# Patient Record
Sex: Male | Born: 1970
Health system: Southern US, Community
[De-identification: ages and names within clinical notes are randomized; demographics above are authoritative.]

## PROBLEM LIST (undated history)

## (undated) DIAGNOSIS — E119 Type 2 diabetes mellitus without complications: Principal | ICD-10-CM

## (undated) DIAGNOSIS — R7989 Other specified abnormal findings of blood chemistry: Secondary | ICD-10-CM

## (undated) HISTORY — PX: SHOULDER ARTHROSCOPY: SHX128

## (undated) HISTORY — PX: INCISE AND DRAIN ABCESS: PRO64

## (undated) HISTORY — PX: APPENDECTOMY: SHX54

## (undated) HISTORY — DX: Type 2 diabetes mellitus without complications: E11.9

## (undated) HISTORY — DX: Other specified abnormal findings of blood chemistry: R79.89

---

## 2002-06-11 ENCOUNTER — Emergency Department (HOSPITAL_COMMUNITY): Admission: EM | Admit: 2002-06-11 | Discharge: 2002-06-11 | Payer: Self-pay | Admitting: Emergency Medicine

## 2002-06-11 ENCOUNTER — Encounter: Payer: Self-pay | Admitting: Emergency Medicine

## 2012-11-18 LAB — HEMOGLOBIN A1C: Hgb A1c MFr Bld: 12.7 % — AB (ref 4.0–6.0)

## 2012-11-18 LAB — CBC AND DIFFERENTIAL: Hemoglobin: 13.3 g/dL — AB (ref 13.5–17.5)

## 2012-11-27 ENCOUNTER — Ambulatory Visit (INDEPENDENT_AMBULATORY_CARE_PROVIDER_SITE_OTHER): Payer: BC Managed Care – PPO | Admitting: Family Medicine

## 2012-11-27 ENCOUNTER — Encounter: Payer: Self-pay | Admitting: Family Medicine

## 2012-11-27 VITALS — BP 136/68 | HR 93 | Temp 97.8°F | Ht 70.5 in | Wt 219.0 lb

## 2012-11-27 DIAGNOSIS — E1169 Type 2 diabetes mellitus with other specified complication: Secondary | ICD-10-CM | POA: Insufficient documentation

## 2012-11-27 DIAGNOSIS — L0291 Cutaneous abscess, unspecified: Secondary | ICD-10-CM

## 2012-11-27 DIAGNOSIS — B351 Tinea unguium: Secondary | ICD-10-CM | POA: Insufficient documentation

## 2012-11-27 DIAGNOSIS — E119 Type 2 diabetes mellitus without complications: Secondary | ICD-10-CM

## 2012-11-27 DIAGNOSIS — R7989 Other specified abnormal findings of blood chemistry: Secondary | ICD-10-CM

## 2012-11-27 DIAGNOSIS — Z09 Encounter for follow-up examination after completed treatment for conditions other than malignant neoplasm: Secondary | ICD-10-CM

## 2012-11-27 HISTORY — DX: Type 2 diabetes mellitus without complications: E11.9

## 2012-11-27 HISTORY — DX: Other specified abnormal findings of blood chemistry: R79.89

## 2012-11-27 NOTE — Progress Notes (Signed)
CC: Shane Williams is a 42 y.o. male is here for Establish Care and hospital f/u   Subjective: HPI:  29-31st Group b strep  Very pleasant 42 year old here to establish care. Recently admitted from January 29 through the 31st for abscess of the left groin. Required multiple rounds of incision and drainage. He was on vancomycin until culture and sensitivity revealed group B strep sensitive to Bactrim. He is still being seen by [facility] surgical and has a visit with them this afternoon for continuing wound management. He has a history of type 2 diabetes which was found to be significantly worsened while in the hospital.   History type 2 diabetes: 2 years ago was on metformin and sulfonylurea. He was able to get his sugar level under control to the point were he was diet controlled diabetic for the past 2 years. He admits that he has not been to a doctor in over 2 years so he is unsure if he was truly at goal with his blood sugars.  On hospital A1c was reportedly 12.7.  on discharge she was started on metformin 500 mg twice a day. He shows me blood sugars that he's been taking over the past 3 days he has had both fasting and postprandial sugars no greater than 100 and no lower than 70. He has an intense desire to be on as little medications as possible. She denies polyphagia polydipsia nor polyuria. He denies poorly healing wounds nor foot lesions.  Review of Systems - General ROS: negative for - chills, fever, night sweats, weight gain or weight loss Ophthalmic ROS: negative for - decreased vision Psychological ROS: negative for - anxiety or depression ENT ROS: negative for - hearing change, nasal congestion, tinnitus or allergies Hematological and Lymphatic ROS: negative for - bleeding problems, bruising or swollen lymph nodes Breast ROS: negative Respiratory ROS: no cough, shortness of breath, or wheezing Cardiovascular ROS: no chest pain or dyspnea on exertion Gastrointestinal ROS: no abdominal  pain, change in bowel habits, or black or bloody stools Genito-Urinary ROS: negative for - genital discharge, genital ulcers, incontinence or abnormal bleeding from genitals Musculoskeletal ROS: negative for - joint pain or muscle pain Neurological ROS: negative for - headaches or memory loss Dermatological ROS: negative for lumps, mole changes, rash and skin lesion changes  Past Medical History  Diagnosis Date  . Low testosterone 11/27/2012  . Type 2 diabetes mellitus 11/27/2012     History reviewed. No pertinent family history.   History  Substance Use Topics  . Smoking status: Not on file  . Smokeless tobacco: Not on file  . Alcohol Use: Not on file     Objective: Filed Vitals:   11/27/12 0926  BP: 136/68  Pulse: 93  Temp: 97.8 F (36.6 C)    General: Alert and Oriented, No Acute Distress HEENT: Pupils equal, round, reactive to light. Conjunctivae clear.   Moist mucous membranes, pharynx without inflammation nor lesions.  Neck supple without palpable lymphadenopathy nor abnormal masses. Lungs: Clear to auscultation bilaterally, no wheezing/ronchi/rales.  Comfortable work of breathing. Good air movement. Cardiac: Regular rate and rhythm. Normal S1/S2.  No murmurs, rubs, nor gallops.   Abdomen: Obese soft nontender Extremities: No peripheral edema.  Strong peripheral pulses.  Feet: Dorsalis pedis pulses 1+ bilaterally.  Monofilament sensation intact on plantar and dorsal surface bilaterally.  No signs of infection, skin breakdown, nor ulceration. There is moderate cracking on both feet and hyperkeratotic soles. Mental Status: No depression, anxiety, nor agitation. Skin: Warm and  dry. Clean dry and intact surgical incision in the left groin x2 without signs of active infection. Granulation tissue present within the surgical bed.  Assessment & Plan: Brahim was seen today for establish care and hospital f/u.  Diagnoses and associated orders for this visit:  Type 2 diabetes  mellitus - BASIC METABOLIC PANEL WITH GFR - Lipid panel - Microalbumin / creatinine urine ratio - Ambulatory referral to Podiatry  Abscess  Hospital discharge follow-up  Other Orders  - metFORMIN (GLUCOPHAGE) 500 MG tablet; Take 500 mg by mouth daily.  Type 2 diabetes: Improved and controlled. Given his desire to minimize medications if possible and his robust improvement in blood sugars we will try a single dose of metformin daily. He and his wife understand that if fasting blood sugars are above 120 or postprandial above 160 he will restart the twice a day regimen. He is open to seeing podiatry given the cracking and hyperkeratosis of his feet. Assess: Appears to be improving, stable, the for care to [facility]  Return in about 2 weeks (around 12/11/2012).

## 2012-11-30 ENCOUNTER — Encounter: Payer: Self-pay | Admitting: *Deleted

## 2012-12-07 LAB — BASIC METABOLIC PANEL WITH GFR
BUN: 10 mg/dL (ref 6–23)
CO2: 27 mEq/L (ref 19–32)
Calcium: 10 mg/dL (ref 8.4–10.5)
Chloride: 98 mEq/L (ref 96–112)
Creat: 0.81 mg/dL (ref 0.50–1.35)
GFR, Est African American: >89 mL/min
Glucose, Bld: 174 mg/dL — ABNORMAL HIGH (ref 70–99)
Potassium: 4.1 mEq/L (ref 3.5–5.3)
Sodium: 135 mEq/L (ref 135–145)

## 2012-12-07 LAB — LIPID PANEL
LDL Cholesterol: 119 mg/dL — ABNORMAL HIGH (ref 0–99)
Total CHOL/HDL Ratio: 5.2 Ratio
Triglycerides: 210 mg/dL — ABNORMAL HIGH (ref <150)
VLDL: 42 mg/dL — ABNORMAL HIGH (ref 0–40)

## 2012-12-08 ENCOUNTER — Telehealth: Payer: Self-pay | Admitting: Family Medicine

## 2012-12-08 DIAGNOSIS — E785 Hyperlipidemia, unspecified: Secondary | ICD-10-CM

## 2012-12-08 LAB — MICROALBUMIN / CREATININE URINE RATIO
Microalb Creat Ratio: 3.9 mg/g (ref 0.0–30.0)
Microalb, Ur: 0.5 mg/dL (ref 0.00–1.89)

## 2012-12-08 MED ORDER — ATORVASTATIN CALCIUM 20 MG PO TABS: 20.0000 mg | ORAL_TABLET | Freq: Every day | ORAL | Status: DC

## 2012-12-08 MED ORDER — ONETOUCH ULTRASOFT LANCETS MISC: Status: AC

## 2012-12-08 NOTE — Telephone Encounter (Signed)
Pt notified and refill sent for lancets to [facility].I called in Lipitor as well

## 2012-12-08 NOTE — Telephone Encounter (Signed)
Sue Lush, Will you please let Mr. Kirsh know that his fasting cholesterol labs reveal a LDL-Cholesterol of 119, above a goal of less than 100 for individuals with type 2 diabetes.  I would encourage him to start a cholesterol medication such as Lipitor.  His urine studies were well within normal limits. Looks like he has an appt with me on Friday. Will you please call in the associated Lipitor Rx with this phone note, I'm unsure about what pharmacy he's using.

## 2012-12-11 ENCOUNTER — Ambulatory Visit (INDEPENDENT_AMBULATORY_CARE_PROVIDER_SITE_OTHER): Payer: BC Managed Care – PPO | Admitting: Family Medicine

## 2012-12-11 ENCOUNTER — Encounter: Payer: Self-pay | Admitting: Family Medicine

## 2012-12-11 VITALS — BP 121/77 | HR 94 | Ht 70.5 in | Wt 229.0 lb

## 2012-12-11 DIAGNOSIS — E291 Testicular hypofunction: Secondary | ICD-10-CM

## 2012-12-11 DIAGNOSIS — E119 Type 2 diabetes mellitus without complications: Secondary | ICD-10-CM

## 2012-12-11 DIAGNOSIS — E785 Hyperlipidemia, unspecified: Secondary | ICD-10-CM

## 2012-12-11 DIAGNOSIS — R7989 Other specified abnormal findings of blood chemistry: Secondary | ICD-10-CM

## 2012-12-11 DIAGNOSIS — N529 Male erectile dysfunction, unspecified: Secondary | ICD-10-CM

## 2012-12-11 MED ORDER — TADALAFIL 20 MG PO TABS: 20.0000 mg | ORAL_TABLET | Freq: Every day | ORAL | Status: DC | PRN

## 2012-12-11 MED ORDER — METFORMIN HCL 500 MG PO TABS: ORAL_TABLET | ORAL | Status: DC

## 2012-12-11 MED ORDER — ASPIRIN EC 81 MG PO TBEC: 81.0000 mg | DELAYED_RELEASE_TABLET | Freq: Every day | ORAL | Status: AC

## 2012-12-11 NOTE — Progress Notes (Signed)
CC: Shane Williams is a 42 y.o. male is here for f/u labs   Subjective: HPI:   Followup type 2 diabetes: Has been taking metformin 500 mg twice a day. Reports fasting sugars consistently less than 120. Reports sugars before eating can range between 90 and 180. No postprandial sugars. Denies tremor, sweats, anxiety, fatigue or any other hypoglycemic-like symptoms. Denies poorly healing wounds nor motor or sensory disturbances. Has podiatry appointment next Monday.  History of erectile dysfunction and low testosterone : He's noticed that ability to maintain and initiate erection has worsened since stopping Cialis and testosterone injections. It is been 6 months since he last received an injection. When he receive injections he reported good libido, improved erections.  Denies issues with libido or fatigue Currently  Followup hyperlipidemia: Since her last visit he was started on Lipitor. He denies myalgias, right upper quadrant pain, nor skin or scleral discoloration. No formal exercise do to healing surgical wound, tries to watch what he eats   Review Of Systems Outlined In HPI  Past Medical History  Diagnosis Date  . Low testosterone 11/27/2012  . Type 2 diabetes mellitus 11/27/2012     History reviewed. No pertinent family history.   History  Substance Use Topics  . Smoking status: Current Every Day Smoker -- 0.25 packs/day  . Smokeless tobacco: Not on file  . Alcohol Use: Yes     Objective: Filed Vitals:   12/11/12 0916  BP: 121/77  Pulse: 94    General: Alert and Oriented, No Acute Distress HEENT: Pupils equal, round, reactive to light. Conjunctivae clear.  Moist mucous membranes Lungs: Clear to auscultation bilaterally, no wheezing/ronchi/rales.  Comfortable work of breathing. Good air movement. Cardiac: Regular rate and rhythm. Normal S1/S2.  No murmurs, rubs, nor gallops.   Abdomen: Soft and nontender Extremities: No peripheral edema.  Strong peripheral pulses.  Mental  Status: No depression, anxiety, nor agitation. Skin: Warm and dry.  Assessment & Plan: Shane Williams was seen today for f/u labs.  Diagnoses and associated orders for this visit:  Hyperlipidemia LDL goal <100  Type 2 diabetes mellitus - aspirin EC 81 MG tablet; Take 1 tablet (81 mg total) by mouth daily. - metFORMIN (GLUCOPHAGE) 500 MG tablet; Two in the morning and one in the evening.  Low testosterone - Testosterone, free, total - CBC - PSA  Erectile dysfunction - tadalafil (CIALIS) 20 MG tablet; Take 1 tablet (20 mg total) by mouth daily as needed for erectile dysfunction.    Hyperlipidemia: Controlled, continue Lipitor recheck 3 months Take 2 diabetes: Uncontrolled, increasing metformin to twice in the morning 1 in the evening Low testosterone: Uncontrolled, obtaining testosterone PSA and hemoglobin today and if candidate for replacement therapy will likely return to injections. Erectile dysfunction: Uncontrolled, restart Cialis until testosterone therapy restores levels to therapeutic range  Return in about 2 months (around 02/08/2013).

## 2012-12-16 LAB — CBC
HCT: 45.5 % (ref 39.0–52.0)
MCV: 83.5 fL (ref 78.0–100.0)
RBC: 5.45 MIL/uL (ref 4.22–5.81)
RDW: 13.6 % (ref 11.5–15.5)
WBC: 7.7 10*3/uL (ref 4.0–10.5)

## 2012-12-17 LAB — TESTOSTERONE, FREE, TOTAL, SHBG: Testosterone-% Free: 2.7 % (ref 1.6–2.9)

## 2012-12-24 ENCOUNTER — Encounter: Payer: Self-pay | Admitting: *Deleted

## 2012-12-24 ENCOUNTER — Telehealth: Payer: Self-pay | Admitting: *Deleted

## 2012-12-24 NOTE — Telephone Encounter (Signed)
Pt agrees to starting testosterone injections.  I transferred him to the girls up front to be put on the schedule.

## 2012-12-29 ENCOUNTER — Other Ambulatory Visit: Payer: Self-pay | Admitting: *Deleted

## 2012-12-29 ENCOUNTER — Ambulatory Visit (INDEPENDENT_AMBULATORY_CARE_PROVIDER_SITE_OTHER): Payer: BC Managed Care – PPO | Admitting: Family Medicine

## 2012-12-29 DIAGNOSIS — R7989 Other specified abnormal findings of blood chemistry: Secondary | ICD-10-CM

## 2012-12-29 DIAGNOSIS — E291 Testicular hypofunction: Secondary | ICD-10-CM

## 2012-12-29 MED ORDER — TESTOSTERONE CYPIONATE 200 MG/ML IM SOLN
300.0000 mg | Freq: Once | INTRAMUSCULAR | Status: AC
  Administered 2012-12-29: 300 mg via INTRAMUSCULAR

## 2012-12-29 MED ORDER — AMBULATORY NON FORMULARY MEDICATION: Status: DC

## 2012-12-29 NOTE — Progress Notes (Signed)
I was present for all necessary aspects of this visit 

## 2012-12-29 NOTE — Progress Notes (Signed)
  Subjective:    Patient ID: Shane Williams, male    DOB: 1971/04/19, 42 y.o.   MRN: [id number] Testosterone injection given without difficulty. HPI    Review of Systems     Objective:   Physical Exam        Assessment & Plan:

## 2012-12-29 NOTE — Telephone Encounter (Signed)
Opened in error

## 2013-01-19 ENCOUNTER — Ambulatory Visit (INDEPENDENT_AMBULATORY_CARE_PROVIDER_SITE_OTHER): Payer: BC Managed Care – PPO | Admitting: Family Medicine

## 2013-01-19 DIAGNOSIS — R7989 Other specified abnormal findings of blood chemistry: Secondary | ICD-10-CM

## 2013-01-19 DIAGNOSIS — E291 Testicular hypofunction: Secondary | ICD-10-CM

## 2013-01-19 MED ORDER — TESTOSTERONE CYPIONATE 200 MG/ML IM SOLN
300.0000 mg | INTRAMUSCULAR | Status: DC
  Administered 2013-01-19: 300 mg via INTRAMUSCULAR

## 2013-01-19 NOTE — Progress Notes (Signed)
I was present for all necessary aspects of this encounter 

## 2013-01-19 NOTE — Progress Notes (Signed)
  Subjective:    Patient ID: Shane Williams, male    DOB: Jul 20, 1971, 42 y.o.   MRN: [id number] Testosterone injection 300mg  given IM without difficulty. Patient tolerated well. Barry Dienes, LPN  HPI    Review of Systems     Objective:   Physical Exam        Assessment & Plan:

## 2013-02-08 ENCOUNTER — Ambulatory Visit: Payer: BC Managed Care – PPO

## 2013-05-21 ENCOUNTER — Ambulatory Visit (INDEPENDENT_AMBULATORY_CARE_PROVIDER_SITE_OTHER): Payer: BC Managed Care – PPO | Admitting: Sports Medicine

## 2013-05-21 VITALS — BP 103/62 | HR 78

## 2013-05-21 DIAGNOSIS — E291 Testicular hypofunction: Secondary | ICD-10-CM

## 2013-05-21 DIAGNOSIS — R7989 Other specified abnormal findings of blood chemistry: Secondary | ICD-10-CM

## 2013-05-21 MED ORDER — TESTOSTERONE CYPIONATE 200 MG/ML IM SOLN
300.0000 mg | Freq: Once | INTRAMUSCULAR | Status: AC
  Administered 2013-05-21: 300 mg via INTRAMUSCULAR

## 2013-05-21 MED ORDER — TESTOSTERONE CYPIONATE 200 MG/ML IM SOLN
200.0000 mg | Freq: Once | INTRAMUSCULAR | Status: DC
  Administered 2013-05-21: 200 mg via INTRAMUSCULAR

## 2013-05-21 NOTE — Progress Notes (Addendum)
  Subjective:    Patient ID: Shane Williams, male    DOB: December 10, 1970, 42 y.o.   MRN: [id number]  HPI   Here for a testosterone injection. Pt's last testosterone labs were drawn in April. Printed lab order for him to get labs Review of Systems     Objective:   Physical Exam        Assessment & Plan:  Injection given without complication. We will call pt with labs results and at that time will let pt know when and if he needs to continue injections  I was present for all essential parts of this visit and procedure. Ihor Austin. Benjamin Stain, M.D.

## 2013-05-21 NOTE — Assessment & Plan Note (Addendum)
Testosterone injection given, return in 2 weeks for next injection. He should get his levels checked one week after his next injection.  Testosterone levels were checked on the same day as his injection, this likely explains low levels. He needs to get his testosterone levels rechecked in 2 days. Order placed. Follow this up with PCP.

## 2013-05-25 NOTE — Addendum Note (Signed)
Addended by: Monica Becton on: 05/25/2013 11:27 AM   Modules accepted: Orders

## 2013-05-31 ENCOUNTER — Other Ambulatory Visit: Payer: Self-pay | Admitting: Family Medicine

## 2013-06-01 NOTE — Addendum Note (Signed)
Addended by: Monica Becton on: 06/01/2013 02:42 PM   Modules accepted: Orders

## 2013-06-02 LAB — TESTOSTERONE, FREE, TOTAL, SHBG
Sex Hormone Binding: 20 nmol/L (ref 13–71)
Testosterone, Free: 148.7 pg/mL (ref 47.0–244.0)
Testosterone-% Free: 2.7 % (ref 1.6–2.9)
Testosterone: 545 ng/dL (ref 300–890)

## 2013-06-30 ENCOUNTER — Ambulatory Visit (INDEPENDENT_AMBULATORY_CARE_PROVIDER_SITE_OTHER): Payer: BC Managed Care – PPO | Admitting: Family Medicine

## 2013-06-30 ENCOUNTER — Encounter: Payer: Self-pay | Admitting: Family Medicine

## 2013-06-30 ENCOUNTER — Telehealth: Payer: Self-pay | Admitting: Family Medicine

## 2013-06-30 VITALS — BP 120/79 | HR 86 | Wt 240.0 lb

## 2013-06-30 DIAGNOSIS — Z5181 Encounter for therapeutic drug level monitoring: Secondary | ICD-10-CM

## 2013-06-30 DIAGNOSIS — R7989 Other specified abnormal findings of blood chemistry: Secondary | ICD-10-CM

## 2013-06-30 DIAGNOSIS — M75101 Unspecified rotator cuff tear or rupture of right shoulder, not specified as traumatic: Secondary | ICD-10-CM

## 2013-06-30 DIAGNOSIS — Z79899 Other long term (current) drug therapy: Secondary | ICD-10-CM

## 2013-06-30 DIAGNOSIS — E119 Type 2 diabetes mellitus without complications: Secondary | ICD-10-CM

## 2013-06-30 DIAGNOSIS — E291 Testicular hypofunction: Secondary | ICD-10-CM

## 2013-06-30 DIAGNOSIS — Z9114 Patient's other noncompliance with medication regimen: Secondary | ICD-10-CM

## 2013-06-30 DIAGNOSIS — E785 Hyperlipidemia, unspecified: Secondary | ICD-10-CM

## 2013-06-30 LAB — POCT GLYCOSYLATED HEMOGLOBIN (HGB A1C): Hemoglobin A1C: 9.8

## 2013-06-30 MED ORDER — TESTOSTERONE CYPIONATE 200 MG/ML IM SOLN
300.0000 mg | Freq: Once | INTRAMUSCULAR | Status: AC
  Administered 2013-06-30: 300 mg via INTRAMUSCULAR

## 2013-06-30 MED ORDER — METFORMIN HCL 500 MG PO TABS: ORAL_TABLET | ORAL | Status: DC

## 2013-06-30 NOTE — Progress Notes (Signed)
CC: Render Marley is a 42 y.o. male is here for Diabetes and testoseterone injection   Subjective: HPI:  Followup type 2 diabetes: At our last visit we increased metformin. He reports that he is taking 500 mg in the morning and 1 g evening less than most days of the week. He admits this is due to forgetfulness he denies any known intolerance. He reports fasting blood sugar 180-200 on days he forgets to take metformin prior, if he is taking metformin as prescribed he reports blood sugars ranging between 100-120 fasting. He denies vision loss, motor sensory disturbances, chest pain, shortness of breath, poorly healing wounds. He admits that her diet at home involves starches for most evening meals.  Followup low testosterone: Patient has been receiving 300 mg testosterone IM every 2 weeks. He reports improvement in fatigue and erectile dysfunction since restarting his regimen 2 months ago. He denies Urinary symptoms chest pain or limb claudication.  Followup hyperlipidemia:  Approximately 5 months ago he was started on Lipitor since then he denies right upper quadrant pain myalgias nor skin or scleral discoloration. He reports good compliance with his medication      Review Of Systems Outlined In HPI  Past Medical History  Diagnosis Date  . Low testosterone 11/27/2012  . Type 2 diabetes mellitus 11/27/2012     Family History  Problem Relation Age of Onset  . Diabetes Father      History  Substance Use Topics  . Smoking status: Current Every Day Smoker -- 0.25 packs/day  . Smokeless tobacco: Not on file  . Alcohol Use: Yes     Objective: Filed Vitals:   06/30/13 1013  BP: 120/79  Pulse: 86    General: Alert and Oriented, No Acute Distress HEENT: Pupils equal, round, reactive to light. Conjunctivae clear.   moist membranes pharynx unremarkable  Lungs: Clear to auscultation bilaterally, no wheezing/ronchi/rales.  Comfortable work of breathing. Good air movement. Cardiac: Regular  rate and rhythm. Normal S1/S2.  No murmurs, rubs, nor gallops.   Abdomen: Normal bowel sounds, soft and non tender without palpable masses. Extremities: No peripheral edema.  Strong peripheral pulses.  Mental Status: No depression, anxiety, nor agitation. Skin: Warm and dry.  Assessment & Plan: Jayson was seen today for diabetes and testoseterone injection.  Diagnoses and associated orders for this visit:  Low testosterone - PSA - CBC - testosterone cypionate (DEPOTESTOTERONE CYPIONATE) injection 300 mg; Inject 1.5 mLs (300 mg total) into the muscle once.  Hyperlipidemia LDL goal <100 - Lipid panel - COMPLETE METABOLIC PANEL WITH GFR  Type 2 diabetes mellitus - POCT HgB A1C - COMPLETE METABOLIC PANEL WITH GFR - metFORMIN (GLUCOPHAGE) 500 MG tablet; Two in the morning and one in the evening.  High risk medication use - PSA - CBC - COMPLETE METABOLIC PANEL WITH GFR  Encounter for monitoring statin therapy - COMPLETE METABOLIC PANEL WITH GFR  Noncompliance with medication regimen  Right rotator cuff tear    Low testosterone: Improved, he is due for PSA and hemoglobin lab tickets were provided   type 2 diabetes: Chronic uncontrolled condition A1c 9, stressed the importance of compliance and limiting starch containing meals, encourage patient to keep medication in car to take a dose when he leaves for work and also when he arrives home from work to help with compliance, we'll not adjust medication at this time since he is not taking it correctly, he expresses understanding in administration instructions Hyperlipidemia: Due for repeat LDL Will check hepatic function  given statin use His right rotator cuff injury is being followed by workers compensation orthopedics  Return for every 2 weeks for testosterone, 3 months for office visit.

## 2013-06-30 NOTE — Telephone Encounter (Signed)
Called [facility] and they are going to credit the pt's account. Pt notified

## 2013-06-30 NOTE — Telephone Encounter (Signed)
Sue Lush, Can you please call [facility] and see if they can credit Froylan's account for an incorrectly ordered testosterone "free, total" level on 05/21/13, this was our clinic's fault.

## 2013-07-06 LAB — COMPLETE METABOLIC PANEL WITH GFR
ALT: 58 U/L — ABNORMAL HIGH (ref 0–53)
AST: 28 U/L (ref 0–37)
Alkaline Phosphatase: 60 U/L (ref 39–117)
CO2: 30 mEq/L (ref 19–32)
GFR, Est African American: >89 mL/min
Sodium: 138 mEq/L (ref 135–145)
Total Bilirubin: 1.2 mg/dL (ref 0.3–1.2)
Total Protein: 6.9 g/dL (ref 6.0–8.3)

## 2013-07-06 LAB — PSA: PSA: 0.45 ng/mL (ref <=4.00)

## 2013-07-06 LAB — CBC
MCH: 29.8 pg (ref 26.0–34.0)
Platelets: 227 10*3/uL (ref 150–400)
RBC: 5.7 MIL/uL (ref 4.22–5.81)
RDW: 13.1 % (ref 11.5–15.5)

## 2013-07-07 ENCOUNTER — Telehealth: Payer: Self-pay | Admitting: Family Medicine

## 2013-07-07 DIAGNOSIS — E119 Type 2 diabetes mellitus without complications: Secondary | ICD-10-CM

## 2013-07-07 MED ORDER — METFORMIN HCL 500 MG PO TABS: ORAL_TABLET | ORAL | Status: DC

## 2013-07-07 NOTE — Telephone Encounter (Signed)
Increase metformin

## 2013-07-14 ENCOUNTER — Ambulatory Visit (INDEPENDENT_AMBULATORY_CARE_PROVIDER_SITE_OTHER): Payer: BC Managed Care – PPO | Admitting: Family Medicine

## 2013-07-14 ENCOUNTER — Encounter: Payer: Self-pay | Admitting: *Deleted

## 2013-07-14 VITALS — BP 128/81 | HR 83 | Wt 248.0 lb

## 2013-07-14 DIAGNOSIS — R7989 Other specified abnormal findings of blood chemistry: Secondary | ICD-10-CM

## 2013-07-14 DIAGNOSIS — E291 Testicular hypofunction: Secondary | ICD-10-CM

## 2013-07-14 MED ORDER — TESTOSTERONE CYPIONATE 200 MG/ML IM SOLN
300.0000 mg | INTRAMUSCULAR | Status: DC
  Administered 2013-07-14: 300 mg via INTRAMUSCULAR

## 2013-07-14 NOTE — Progress Notes (Signed)
I was present for all necessary aspects of today's encounter, recent PSA and hemoglobin without abnormality continue 300 mg every 2 weeks

## 2013-07-14 NOTE — Progress Notes (Signed)
Patient ID: Shane Williams, male   DOB: Dec 05, 1970, 42 y.o.   MRN: [id number]    Patient was given 300 mg of Testosterone on the left ventroglutea area. There was nothing abnormal noted at the injection site. Rhonda Cunningham,CMA

## 2013-07-27 ENCOUNTER — Telehealth: Payer: Self-pay | Admitting: *Deleted

## 2013-07-27 NOTE — Telephone Encounter (Signed)
Spoke with a rep at [employer] lab. They will remove the charge for testosterone order that I ordered on 8/1 since pt didn't need to get his level drawn at that time

## 2013-07-29 ENCOUNTER — Ambulatory Visit: Payer: BC Managed Care – PPO | Admitting: *Deleted

## 2013-09-30 ENCOUNTER — Ambulatory Visit: Payer: BC Managed Care – PPO | Admitting: Family Medicine

## 2013-09-30 DIAGNOSIS — Z0289 Encounter for other administrative examinations: Secondary | ICD-10-CM

## 2013-12-19 ENCOUNTER — Other Ambulatory Visit: Payer: Self-pay | Admitting: Family Medicine

## 2013-12-23 ENCOUNTER — Encounter: Payer: Self-pay | Admitting: Family Medicine

## 2013-12-23 DIAGNOSIS — N529 Male erectile dysfunction, unspecified: Secondary | ICD-10-CM | POA: Insufficient documentation

## 2014-01-05 ENCOUNTER — Ambulatory Visit (INDEPENDENT_AMBULATORY_CARE_PROVIDER_SITE_OTHER): Payer: BC Managed Care – PPO

## 2014-01-05 ENCOUNTER — Encounter: Payer: Self-pay | Admitting: Family Medicine

## 2014-01-05 ENCOUNTER — Ambulatory Visit (INDEPENDENT_AMBULATORY_CARE_PROVIDER_SITE_OTHER): Payer: BC Managed Care – PPO | Admitting: Family Medicine

## 2014-01-05 VITALS — BP 122/80 | HR 101 | Wt 233.0 lb

## 2014-01-05 DIAGNOSIS — M25579 Pain in unspecified ankle and joints of unspecified foot: Secondary | ICD-10-CM

## 2014-01-05 DIAGNOSIS — E291 Testicular hypofunction: Secondary | ICD-10-CM

## 2014-01-05 DIAGNOSIS — M25571 Pain in right ankle and joints of right foot: Secondary | ICD-10-CM

## 2014-01-05 DIAGNOSIS — R7989 Other specified abnormal findings of blood chemistry: Secondary | ICD-10-CM

## 2014-01-05 MED ORDER — TESTOSTERONE CYPIONATE 200 MG/ML IM SOLN
200.0000 mg | Freq: Once | INTRAMUSCULAR | Status: AC
  Administered 2014-01-05: 200 mg via INTRAMUSCULAR

## 2014-01-05 NOTE — Progress Notes (Signed)
CC: Hazle CocaJoseph Milholland is a 43 y.o. male is here for hit ankle on monday on a chair   Subjective: HPI:  Right ankle pain that began acutely yesterday when his medial malleoli struck a chair unintentionally. Pain was immediate and severe in severity unable to bear weight for matter of minutes when this first occurred. Reports immediate swelling and redness with mild bruising that developed yesterday swelling and redness is slightly improved without any intervention other than an uncertain anti-inflammatory medication. Pain is worse to touch and he's not having much difficulty bearing weight now. He denies weakness, motor or sensory disturbances in the right lower extremity.    He is overdue for testosterone supplementation and has missed prior visits due to a death in the family and recent rotator cuff surgery.  Review Of Systems Outlined In HPI  Past Medical History  Diagnosis Date  . Low testosterone 11/27/2012  . Type 2 diabetes mellitus 11/27/2012    No past surgical history on file. Family History  Problem Relation Age of Onset  . Diabetes Father     History   Social History  . Marital Status: Single    Spouse Name: N/A    Number of Children: N/A  . Years of Education: N/A   Occupational History  . Not on file.   Social History Main Topics  . Smoking status: Current Every Day Smoker -- 0.25 packs/day  . Smokeless tobacco: Not on file  . Alcohol Use: Yes  . Drug Use: No  . Sexual Activity: Not on file   Other Topics Concern  . Not on file   Social History Narrative  . No narrative on file     Objective: BP 122/80  Pulse 101  Wt 233 lb (105.688 kg)  General: Alert and Oriented, No Acute Distress HEENT: Pupils equal, round, reactive to light. Conjunctivae clear.   moist mucous membranes  Lungs:  clear comfortable work of breathing  Cardiac: Regular rate and rhythm.  Extremities: No peripheral edema.  Strong peripheral pulses. Inspection of the right ankle shows negative  anterior drawer, no pain over lateral malleoli, pain is reproduced with palpation of the inferior medial-medial malleoli. Pain is again reproduced with compression of tibia and fibula in the middle of the shin. Pain is not reproduced with resisted ankle eversion or inversion. No pain over the navicular or base of the fifth metatarsal. There is mild swelling, scant erythema, mild bruising overlying the inferior aspect of the medial malleoli. Mental Status: No depression, anxiety, nor agitation. Skin: Warm and dry.  Assessment & Plan: [name] was seen today for hit ankle on monday on a chair.  Diagnoses and associated orders for this visit:  Right ankle pain - DG Ankle Complete Right; Future  Low testosterone    Given pain over the malleoli along with pain reproduced with compression plain films were obtained.  Plain films are negative for fracture, avulsion, nor mortise instability. Patient was asked to return to the room after getting films however it appears he has left the building. I called his cell phone and gave him the results and encouraged him to use ice as needed, trace the alphabet in large letters using his ankle to help with range of motion. Should resolve on its end in the next one to 2 weeks.  We plan to give him a testosterone injection however he left the building prior to this opportunity.   Return if symptoms worsen or fail to improve.

## 2014-01-05 NOTE — Progress Notes (Signed)
Patient returned after the prior note was completed and was notified of results and recommendations. He received his testosterone shot today

## 2014-01-05 NOTE — Addendum Note (Signed)
Addended by: Wyline BeadyMCCRIMMON, Dhyan Noah C on: 01/05/2014 02:53 PM   Modules accepted: Orders

## 2014-01-07 ENCOUNTER — Other Ambulatory Visit: Payer: Self-pay | Admitting: *Deleted

## 2014-01-07 DIAGNOSIS — N529 Male erectile dysfunction, unspecified: Secondary | ICD-10-CM

## 2014-01-07 MED ORDER — TADALAFIL 20 MG PO TABS: 20.0000 mg | ORAL_TABLET | Freq: Every day | ORAL | Status: DC | PRN

## 2014-01-10 ENCOUNTER — Other Ambulatory Visit: Payer: Self-pay | Admitting: Family Medicine

## 2014-01-10 DIAGNOSIS — N529 Male erectile dysfunction, unspecified: Secondary | ICD-10-CM

## 2014-01-10 MED ORDER — SILDENAFIL CITRATE 100 MG PO TABS: 100.0000 mg | ORAL_TABLET | Freq: Every day | ORAL | Status: DC | PRN

## 2014-01-10 NOTE — Telephone Encounter (Signed)
Sue LushAndrea, Will you please let Mr. Shane PriceMyers know that [employer] has refused to fully cover cialis.  It looks like they will only cover four doses every 30 day period.  Would he like to continue with this coverage or would he be interested in trying something else like Viagra?

## 2014-01-10 NOTE — Telephone Encounter (Signed)
Pt notified and he ok with viagra

## 2014-01-10 NOTE — Telephone Encounter (Signed)
[facility] sent to [facility]  

## 2014-08-10 ENCOUNTER — Other Ambulatory Visit: Payer: Self-pay

## 2014-08-10 MED ORDER — SILDENAFIL CITRATE 100 MG PO TABS: 100.0000 mg | ORAL_TABLET | Freq: Every day | ORAL | Status: DC | PRN

## 2014-08-10 NOTE — Telephone Encounter (Signed)
Patient transferred to schedule a follow up appointment.

## 2014-08-15 ENCOUNTER — Encounter: Payer: Self-pay | Admitting: Family Medicine

## 2014-08-15 ENCOUNTER — Ambulatory Visit (INDEPENDENT_AMBULATORY_CARE_PROVIDER_SITE_OTHER): Payer: BC Managed Care – PPO | Admitting: Family Medicine

## 2014-08-15 VITALS — BP 128/80 | HR 94 | Ht 70.0 in | Wt 224.0 lb

## 2014-08-15 DIAGNOSIS — E119 Type 2 diabetes mellitus without complications: Secondary | ICD-10-CM | POA: Diagnosis not present

## 2014-08-15 DIAGNOSIS — E785 Hyperlipidemia, unspecified: Secondary | ICD-10-CM | POA: Diagnosis not present

## 2014-08-15 DIAGNOSIS — E349 Endocrine disorder, unspecified: Secondary | ICD-10-CM

## 2014-08-15 DIAGNOSIS — L98469 Non-pressure chronic ulcer of face with unspecified severity: Secondary | ICD-10-CM

## 2014-08-15 DIAGNOSIS — L98499 Non-pressure chronic ulcer of skin of other sites with unspecified severity: Secondary | ICD-10-CM | POA: Diagnosis not present

## 2014-08-15 DIAGNOSIS — E291 Testicular hypofunction: Secondary | ICD-10-CM | POA: Diagnosis not present

## 2014-08-15 MED ORDER — MUPIROCIN 2 % EX OINT: 1.0000 "application " | TOPICAL_OINTMENT | Freq: Two times a day (BID) | CUTANEOUS | Status: DC

## 2014-08-15 MED ORDER — SILDENAFIL CITRATE 100 MG PO TABS: 100.0000 mg | ORAL_TABLET | Freq: Every day | ORAL | Status: DC | PRN

## 2014-08-15 NOTE — Progress Notes (Signed)
CC: Shane Williams is a 43 y.o. male is here for Follow-up   Subjective: HPI:  Follow-up type 2 diabetes: Continues to take metformin twice a day without known side effects. He's been checking his blood pressure periodically at home and states that it looks "good"however he's unable to quantify what these values have been. Denies polyuria polyphagia or polydipsia  Follow-up hyperlipidemia: Continues to take Lipitor daily basis without known side effects. Denies chest pain, myalgias, right upper quadrant pain. No formal exercise routine but he's been able to lose 10 pounds since I saw him last with dietary changes  Follow-up hypogonadism: He is interested in possibly restarting testosterone supplementation and would like his testosterone checked to see if he still candidate.  Complains of a wound on the skin just underneath the left jaw that has been present for the past week. It seems to be taking longer than normal to heal. There has been some bleeding, it's painless. Seems to be worse the more he shaves. Nothing else makes better or worse. Denies fevers or chills   Review Of Systems Outlined In HPI  Past Medical History  Diagnosis Date  . Low testosterone 11/27/2012  . Type 2 diabetes mellitus 11/27/2012    No past surgical history on file. Family History  Problem Relation Age of Onset  . Diabetes Father     History   Social History  . Marital Status: Single    Spouse Name: N/A    Number of Children: N/A  . Years of Education: N/A   Occupational History  . Not on file.   Social History Main Topics  . Smoking status: Current Every Day Smoker -- 0.25 packs/day  . Smokeless tobacco: Not on file  . Alcohol Use: Yes  . Drug Use: No  . Sexual Activity: Not on file   Other Topics Concern  . Not on file   Social History Narrative  . No narrative on file     Objective: BP 128/80  Pulse 94  Ht 5\' 10"  (1.778 m)  Wt 224 lb (101.606 kg)  BMI 32.14 kg/m2  General: Alert and  Oriented, No Acute Distress HEENT: Pupils equal, round, reactive to light. Conjunctivae clear.  Moist because membranes times unremarkable. No palpable masses in the neck. Lungs: Clear to auscultation bilaterally, no wheezing/ronchi/rales.  Comfortable work of breathing. Good air movement. Cardiac: Regular rate and rhythm. Normal S1/S2.  No murmurs, rubs, nor gallops.   Mental Status: No depression, anxiety, nor agitation. Skin: Warm and dry. 5 mm diameter shallow ulceration underneath the jaw of the left neck  Assessment & Plan: Shane Williams was seen today for follow-up.  Diagnoses and associated orders for this visit:  Type 2 diabetes mellitus without complication - Hemoglobin A1c  Hyperlipidemia LDL goal <100 - Lipid panel  Skin ulcer of face, with unspecified severity - mupirocin ointment (BACTROBAN) 2 %; Place 1 application into the nose 2 (two) times daily. To facial lesion for one week.  Hypotestosteronism - Testosterone  Other Orders - sildenafil (VIAGRA) 100 MG tablet; Take 1 tablet (100 mg total) by mouth daily as needed for erectile dysfunction. May take half dose if beneficial.    Type 2 diabetes: Continue metformin pending A1c Hyperlipidemia: Continue atorvastatin pending lipid panel Ulceration of the face: Appears to be an abrasion, should improve with Bactroban for the next week and avoid further friction Hypotestosteronism: Recheck testosterone Erectile dysfunction: Requesting refills of Viagra    Return in about 3 months (around 11/15/2014).

## 2014-08-16 LAB — LIPID PANEL
CHOLESTEROL: 202 mg/dL — AB (ref 0–200)
HDL: 32 mg/dL — AB (ref >39)
LDL Cholesterol: 136 mg/dL — ABNORMAL HIGH (ref 0–99)
Total CHOL/HDL Ratio: 6.3 Ratio
Triglycerides: 171 mg/dL — ABNORMAL HIGH (ref <150)
VLDL: 34 mg/dL (ref 0–40)

## 2014-08-16 LAB — HEMOGLOBIN A1C
Hgb A1c MFr Bld: 13 % — ABNORMAL HIGH (ref <5.7)
MEAN PLASMA GLUCOSE: 326 mg/dL — AB (ref <117)

## 2014-08-16 LAB — TESTOSTERONE: Testosterone: 222 ng/dL — ABNORMAL LOW (ref 300–890)

## 2014-08-19 ENCOUNTER — Telehealth: Payer: Self-pay | Admitting: Family Medicine

## 2014-08-19 DIAGNOSIS — Z79899 Other long term (current) drug therapy: Secondary | ICD-10-CM

## 2014-08-19 DIAGNOSIS — E119 Type 2 diabetes mellitus without complications: Secondary | ICD-10-CM

## 2014-08-19 DIAGNOSIS — E291 Testicular hypofunction: Secondary | ICD-10-CM

## 2014-08-19 MED ORDER — DAPAGLIFLOZIN PRO-METFORMIN ER 5-1000 MG PO TB24: 1.0000 | ORAL_TABLET | Freq: Two times a day (BID) | ORAL | Status: DC

## 2014-08-19 NOTE — Telephone Encounter (Signed)
Sue Lushndrea, Will you please let patient know that his A1c was 13 which reflects severely elevated blood sugar.  I'd recommend he stop his current metformin and start Xigduo XR which contains metformin and another sugar lowering medication.  (This is free if he uses one of the savings cards in the closet, can you please provide him with this and phone in the Rx to his pharmacy of choice).  Cholesterol is mildly uncontrolled but should improve with this new medication above.  Testosterone is still low, if he's interested in replacement therapy we'll need to check a hemoglobin and psa first.  F/U 3 months after starting Xigduo.

## 2014-08-22 NOTE — Telephone Encounter (Signed)
We don't have Xigduo rebate cards. Does this change your plan?

## 2014-08-23 NOTE — Telephone Encounter (Signed)
No change to the plan, I placed one of these cards in your inbox for Mr. Shane Williams.

## 2014-08-23 NOTE — Telephone Encounter (Signed)
Called home # but I think it is his wifes # left message for him to call back , called cell and its incorrect # Number

## 2014-08-30 MED ORDER — DAPAGLIFLOZIN PRO-METFORMIN ER 5-1000 MG PO TB24: 5.0000 mg | ORAL_TABLET | Freq: Two times a day (BID) | ORAL | Status: DC

## 2014-08-30 NOTE — Telephone Encounter (Signed)
Hollace Haywardndrea, Xigduo has now been e-Rxed by me to his [facility] for Hgb/PSA in your inbox ready to be obtained.

## 2014-08-30 NOTE — Telephone Encounter (Signed)
Patient advised of recommendations.  1-  He would like to start the testosterone replacements and would like to come in the morning for labs.  2- He is willing to start the Xigduo and stop Metformin. I called the pharmacy and his cost for the medication would be 15 dollars a month. No PA needed. The pharmacy however has not received the prescription. I was going to e prescribe it to the pharmacy but it gives me a warning on amount prescribed. Please advise.

## 2014-08-31 NOTE — Telephone Encounter (Signed)
Pt.notified

## 2014-09-01 LAB — HEMOGLOBIN: HEMOGLOBIN: 16.8 g/dL (ref 13.0–17.0)

## 2014-09-02 ENCOUNTER — Telehealth: Payer: Self-pay | Admitting: *Deleted

## 2014-09-02 LAB — PSA: PSA: 0.51 ng/mL (ref <=4.00)

## 2014-09-02 NOTE — Telephone Encounter (Signed)
Accidentally closed results encounter.Pt notified about testosterone labs and he would like to do the testosterone injections here.Pt will call back on Monday to schedule a nurse visit

## 2014-09-05 ENCOUNTER — Other Ambulatory Visit: Payer: Self-pay | Admitting: Family Medicine

## 2014-09-05 ENCOUNTER — Ambulatory Visit (INDEPENDENT_AMBULATORY_CARE_PROVIDER_SITE_OTHER): Payer: BC Managed Care – PPO | Admitting: Family Medicine

## 2014-09-05 VITALS — BP 108/67 | HR 98

## 2014-09-05 DIAGNOSIS — E291 Testicular hypofunction: Secondary | ICD-10-CM

## 2014-09-05 MED ORDER — TESTOSTERONE CYPIONATE 200 MG/ML IM SOLN
200.0000 mg | Freq: Once | INTRAMUSCULAR | Status: AC
  Administered 2014-09-05: 200 mg via INTRAMUSCULAR

## 2014-09-05 NOTE — Progress Notes (Signed)
   Subjective:    Patient ID: Shane CocaJoseph Williams, male    DOB: May 05, 1971, 43 y.o.   MRN: [id number]  HPI  Shane LongsJoseph is here for his first restart of testosterone injection. Patient advised to call if he has shortness of breath, chest pain, headaches or mood changes.   Review of Systems     Objective:   Physical Exam        Assessment & Plan:  Patient tolerated injection well without complications. Patient advised to schedule next injection 21 days from today.

## 2014-09-26 ENCOUNTER — Ambulatory Visit (INDEPENDENT_AMBULATORY_CARE_PROVIDER_SITE_OTHER): Payer: BC Managed Care – PPO | Admitting: Family Medicine

## 2014-09-26 VITALS — BP 111/70 | HR 95 | Wt 227.0 lb

## 2014-09-26 DIAGNOSIS — E291 Testicular hypofunction: Secondary | ICD-10-CM | POA: Diagnosis not present

## 2014-09-26 MED ORDER — TESTOSTERONE CYPIONATE 200 MG/ML IM SOLN
200.0000 mg | Freq: Once | INTRAMUSCULAR | Status: AC
  Administered 2014-09-26: 200 mg via INTRAMUSCULAR

## 2014-09-26 NOTE — Progress Notes (Signed)
   Subjective:    Patient ID: Shane Williams, male    DOB: 03-04-1971, 43 y.o.   MRN: [id number]  HPI  Shane Williams is here for a testosterone injection. Denies chest pain, shortness of breath, headaches or mood changes.   Review of Systems     Objective:   Physical Exam        Assessment & Plan:  Patient tolerated injection well without complications. Patient advised to schedule next injection 21 days from today.

## 2014-10-19 ENCOUNTER — Ambulatory Visit (INDEPENDENT_AMBULATORY_CARE_PROVIDER_SITE_OTHER): Payer: BC Managed Care – PPO | Admitting: Family Medicine

## 2014-10-19 VITALS — BP 118/78 | HR 89 | Ht 70.0 in | Wt 230.0 lb

## 2014-10-19 DIAGNOSIS — E291 Testicular hypofunction: Secondary | ICD-10-CM

## 2014-10-19 MED ORDER — TESTOSTERONE CYPIONATE 100 MG/ML IM SOLN
200.0000 mg | Freq: Once | INTRAMUSCULAR | Status: DC
Start: 2014-10-19 — End: 2014-10-19

## 2014-10-19 MED ORDER — TESTOSTERONE CYPIONATE 200 MG/ML IM SOLN: 200.0000 mg | Freq: Once | INTRAMUSCULAR | Status: DC

## 2014-10-19 NOTE — Progress Notes (Signed)
   Subjective:    Patient ID: Shane Williams, male    DOB: 01-31-1971, 43 y.o.   MRN: [id number]  HPI  [name] comes to office today for scheduled testosterone injection which he received without complication in his RUOQ. He currently denies CP, headache, mood swings or hot flashes. No other GU concerns at this time.    Review of Systems     Objective:   Physical Exam        Assessment & Plan:  Return to clinic for next scheduled injection in 21 days.

## 2014-11-07 ENCOUNTER — Ambulatory Visit (INDEPENDENT_AMBULATORY_CARE_PROVIDER_SITE_OTHER): Payer: BLUE CROSS/BLUE SHIELD | Admitting: Family Medicine

## 2014-11-07 VITALS — BP 127/77 | HR 101 | Wt 230.0 lb

## 2014-11-07 DIAGNOSIS — E291 Testicular hypofunction: Secondary | ICD-10-CM

## 2014-11-07 MED ORDER — TESTOSTERONE CYPIONATE 200 MG/ML IM SOLN
200.0000 mg | Freq: Once | INTRAMUSCULAR | Status: AC
  Administered 2014-11-07: 200 mg via INTRAMUSCULAR

## 2014-11-07 NOTE — Progress Notes (Signed)
   Subjective:    Patient ID: Shane Williams, male    DOB: [birth year], 44 y.o.   MRN: [id number]  HPI  Shane Williams is here for a testosterone injection. Denies chest pain, shortness of breath, headaches or mood changes.   Review of Systems     Objective:   Physical Exam        Assessment & Plan:  Patient tolerated injection well without complications. Patient advised to schedule next injection 21 days from today.

## 2014-11-08 IMAGING — CR DG ANKLE COMPLETE 3+V*R*
3 series · 3 of 3 positions shown · non-contrast
Comparison: None.

CLINICAL DATA: Pain post trauma

EXAM:
RIGHT ANKLE - COMPLETE 3+ VIEW

[view not recorded (1 of 3)]
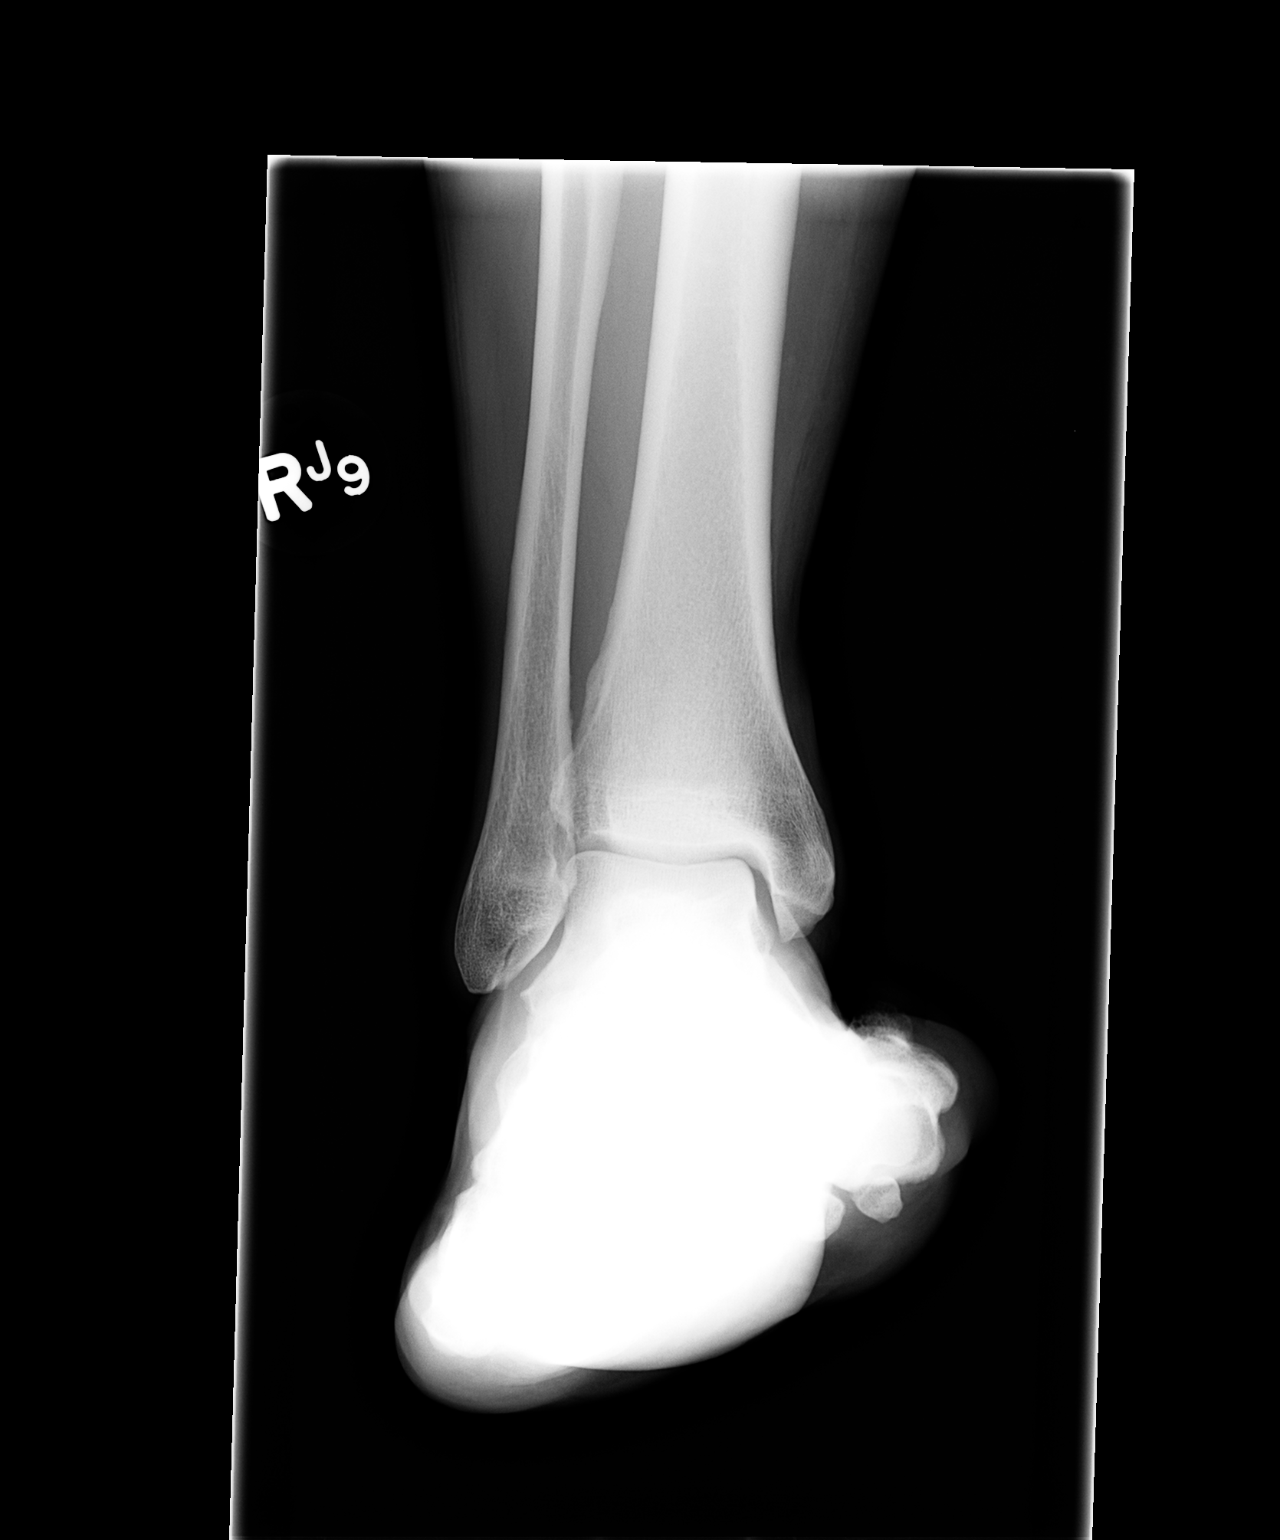

[view not recorded (2 of 3)]
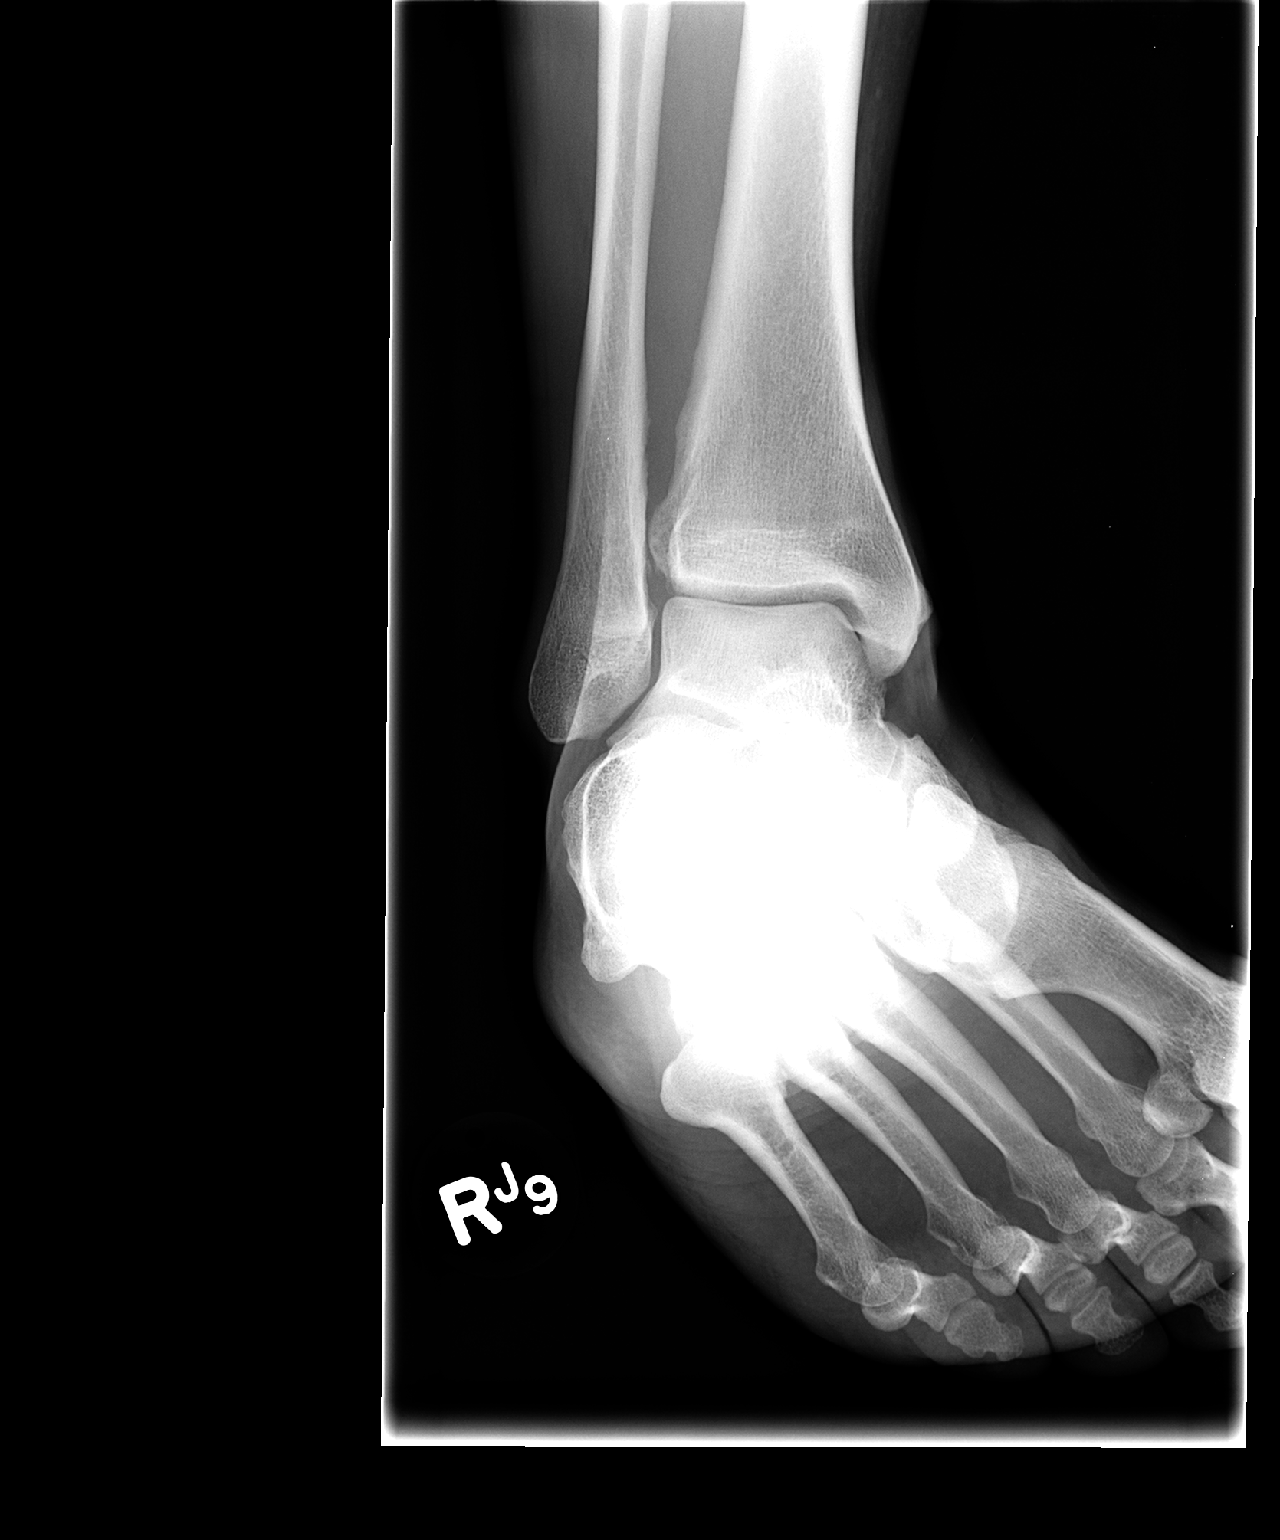

[view not recorded (3 of 3)]
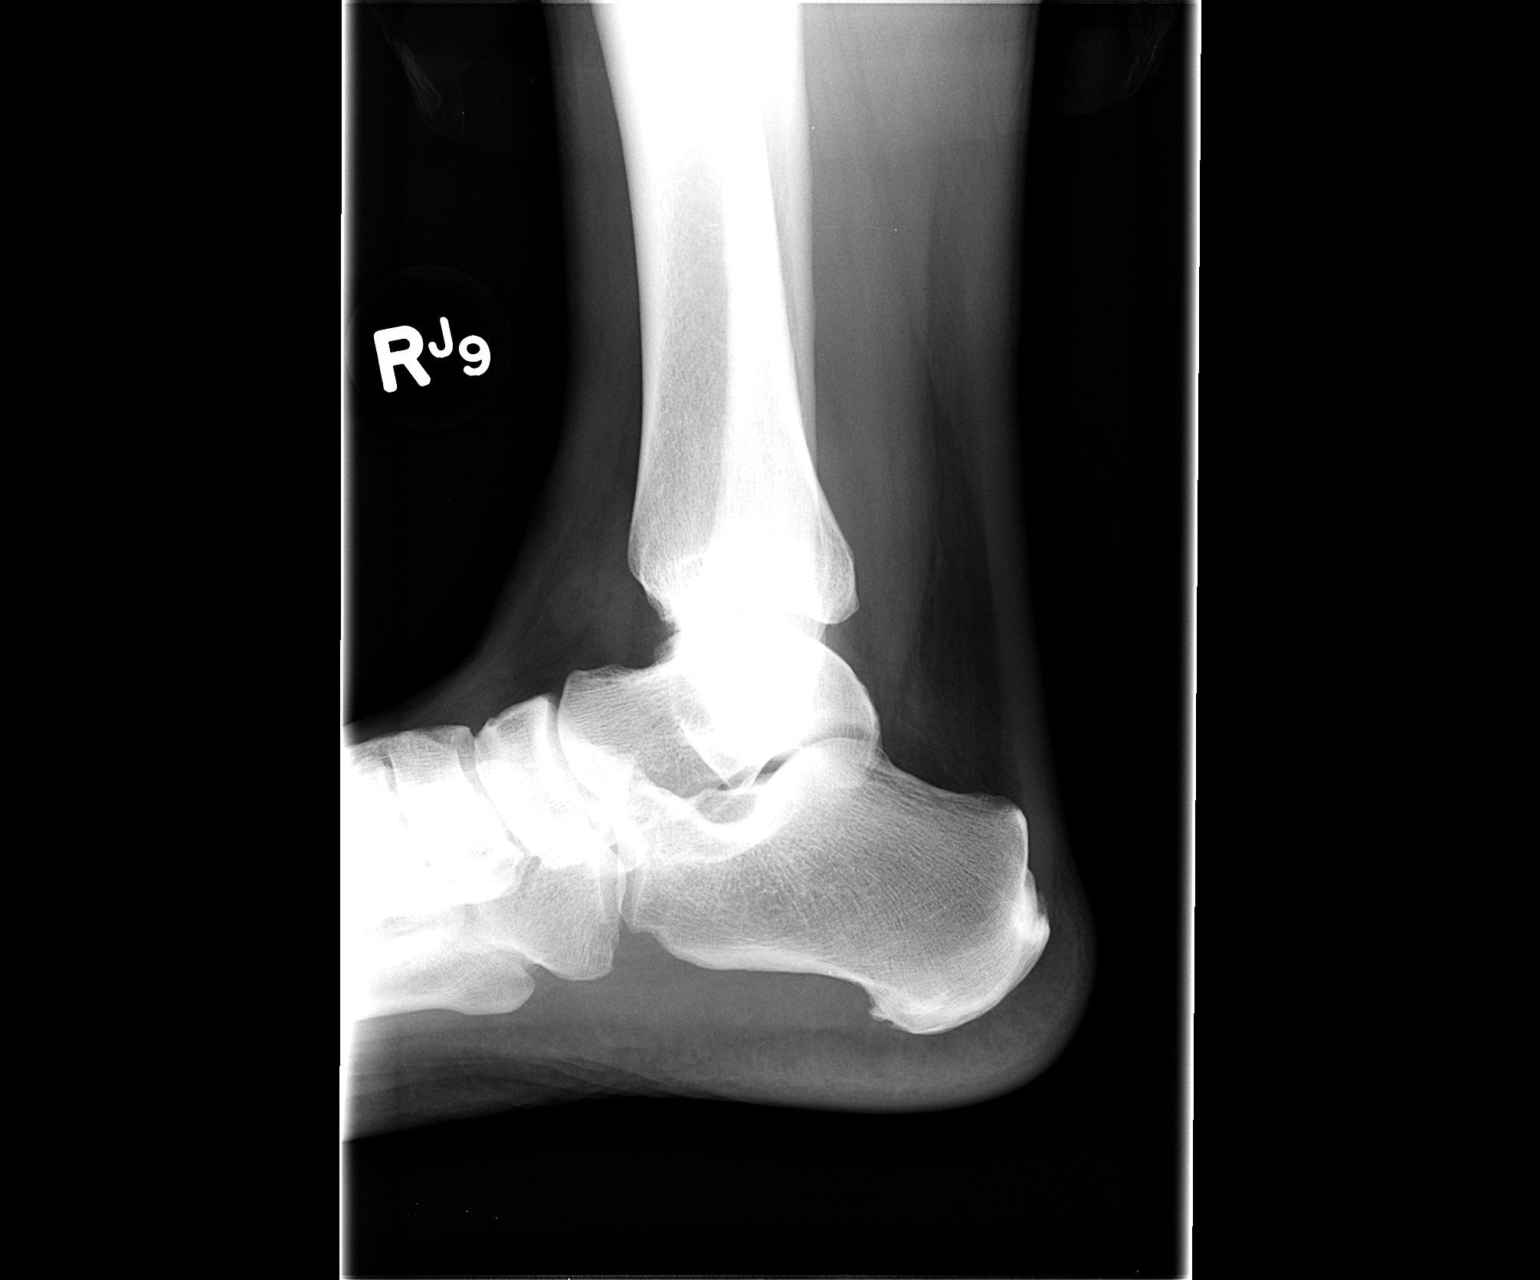

[3 of 3 positions shown; findings below may reference images not displayed]

FINDINGS: Frontal, oblique, lateral views were obtained. No fracture or
effusion. Ankle mortise appears intact. There are minimal spurs
arising from the inferior and posterior calcaneus.
IMPRESSION: No fracture.  Mortise intact.

## 2014-11-15 ENCOUNTER — Ambulatory Visit: Payer: BC Managed Care – PPO | Admitting: Family Medicine

## 2014-11-15 DIAGNOSIS — Z0289 Encounter for other administrative examinations: Secondary | ICD-10-CM

## 2014-11-28 ENCOUNTER — Ambulatory Visit: Payer: BLUE CROSS/BLUE SHIELD

## 2014-12-13 LAB — HM DIABETES EYE EXAM

## 2015-01-19 ENCOUNTER — Encounter: Payer: Self-pay | Admitting: Family Medicine

## 2015-02-24 ENCOUNTER — Ambulatory Visit (INDEPENDENT_AMBULATORY_CARE_PROVIDER_SITE_OTHER): Payer: 59 | Admitting: Family Medicine

## 2015-02-24 ENCOUNTER — Encounter: Payer: Self-pay | Admitting: Family Medicine

## 2015-02-24 VITALS — BP 117/70 | HR 62 | Wt 229.0 lb

## 2015-02-24 DIAGNOSIS — E291 Testicular hypofunction: Secondary | ICD-10-CM | POA: Diagnosis not present

## 2015-02-24 DIAGNOSIS — E119 Type 2 diabetes mellitus without complications: Secondary | ICD-10-CM

## 2015-02-24 DIAGNOSIS — R7989 Other specified abnormal findings of blood chemistry: Secondary | ICD-10-CM

## 2015-02-24 LAB — POCT GLYCOSYLATED HEMOGLOBIN (HGB A1C): HEMOGLOBIN A1C: 7.7

## 2015-02-24 MED ORDER — TADALAFIL 20 MG PO TABS: 10.0000 mg | ORAL_TABLET | ORAL | Status: DC | PRN

## 2015-02-24 MED ORDER — DAPAGLIFLOZIN PRO-METFORMIN ER 5-1000 MG PO TB24: 5.0000 mg | ORAL_TABLET | Freq: Two times a day (BID) | ORAL | Status: DC

## 2015-02-24 MED ORDER — TESTOSTERONE CYPIONATE 200 MG/ML IM SOLN
200.0000 mg | Freq: Once | INTRAMUSCULAR | Status: AC
  Administered 2015-02-24: 200 mg via INTRAMUSCULAR

## 2015-02-24 NOTE — Progress Notes (Signed)
CC: Shane Williams is a 44 y.o. male is here for Diabetes and testosterone   Subjective: HPI:  Follow-up type 2 diabetes: He's been taking xigduo XR 1-2 times a day spacing it out in efforts of trying to save money since he lost insurance a few months ago. He is back to having insurance and for the last 3 weeks has been taking it twice a day. He denies any known side effects. No polyuria polyphagia polydipsia.  No outside blood sugars to report.  Follow-up low testosterone: Since stopping testosterone injections he's noticed the decrease in his energy level. He like to restart taking testosterone injections. Reports worsening difficulty with initiating and maintaining erections since stopping testosterone well. He's been taking Viagra however it's causing intolerable headaches. No exertional chest pain   Review Of Systems Outlined In HPI  Past Medical History  Diagnosis Date  . Low testosterone 11/27/2012  . Type 2 diabetes mellitus 11/27/2012    No past surgical history on file. Family History  Problem Relation Age of Onset  . Diabetes Father     History   Social History  . Marital Status: Married    Spouse Name: N/A  . Number of Children: N/A  . Years of Education: N/A   Occupational History  . Not on file.   Social History Main Topics  . Smoking status: Current Every Day Smoker -- 0.25 packs/day  . Smokeless tobacco: Not on file  . Alcohol Use: Yes  . Drug Use: No  . Sexual Activity: Not on file   Other Topics Concern  . Not on file   Social History Narrative     Objective: BP 117/70 mmHg  Pulse 62  Wt 229 lb (103.874 kg)  General: Alert and Oriented, No Acute Distress HEENT: Pupils equal, round, reactive to light. Conjunctivae clear.  Moist mucous membranes Lungs: Clear to auscultation bilaterally, no wheezing/ronchi/rales.  Comfortable work of breathing. Good air movement. Cardiac: Regular rate and rhythm. Normal S1/S2.  No murmurs, rubs, nor gallops.    Extremities: No peripheral edema.  Strong peripheral pulses.  Mental Status: No depression, anxiety, nor agitation. Skin: Warm and dry.  Assessment & Plan: [name] was seen today for diabetes and testosterone.  Diagnoses and all orders for this visit:  Type 2 diabetes mellitus without complication Orders: -     POCT HgB A1C -     Dapagliflozin-Metformin HCl ER (XIGDUO XR) 02-999 MG TB24; Take 5-1,000 mg by mouth 2 (two) times daily.  Low testosterone Orders: -     testosterone cypionate (DEPOTESTOTERONE CYPIONATE) injection 200 mg; Inject 1 mL (200 mg total) into the muscle once. -     tadalafil (CIALIS) 20 MG tablet; Take 0.5-1 tablets (10-20 mg total) by mouth every other day as needed for erectile dysfunction.   Type 2 diabetes: A1c 7.7, uncontrolled, encouraged to take medication as prescribed twice a day. Hypogonadism: Restarting testosterone injections, protrusion for Cialis provided until testosterone level normalizes which should improve his ED.  Return in about 3 months (around 05/27/2015) for diabetes follow up.

## 2015-03-06 ENCOUNTER — Other Ambulatory Visit: Payer: Self-pay

## 2015-03-06 DIAGNOSIS — E119 Type 2 diabetes mellitus without complications: Secondary | ICD-10-CM

## 2015-03-06 MED ORDER — DAPAGLIFLOZIN PRO-METFORMIN ER 5-1000 MG PO TB24: 5.0000 mg | ORAL_TABLET | Freq: Two times a day (BID) | ORAL | Status: DC

## 2015-03-08 ENCOUNTER — Ambulatory Visit (INDEPENDENT_AMBULATORY_CARE_PROVIDER_SITE_OTHER): Payer: 59 | Admitting: Family Medicine

## 2015-03-08 VITALS — BP 132/88 | HR 99 | Wt 235.0 lb

## 2015-03-08 DIAGNOSIS — E291 Testicular hypofunction: Secondary | ICD-10-CM

## 2015-03-08 MED ORDER — TESTOSTERONE CYPIONATE 200 MG/ML IM SOLN
200.0000 mg | Freq: Once | INTRAMUSCULAR | Status: AC
  Administered 2015-03-08: 200 mg via INTRAMUSCULAR

## 2015-03-08 NOTE — Progress Notes (Signed)
   Subjective:    Patient ID: Shane Williams, male    DOB: 07/14/1971, 44 y.o.   MRN: [id number]  HPI Patient came into office today for testosterone injection. Denies chest pain, shortness of breath, headaches and problems associated with taking this medication. Patient states he has had no abnornal mood swings.    Review of Systems     Objective:   Physical Exam        Assessment & Plan:  Patient tolerated injection in LOUQ well without complications. Patient advised to schedule his next injection for 3 weeks from today.

## 2015-03-09 ENCOUNTER — Ambulatory Visit: Payer: 59

## 2015-03-10 ENCOUNTER — Telehealth: Payer: Self-pay | Admitting: *Deleted

## 2015-03-10 NOTE — Telephone Encounter (Signed)
Shane ChaletAndrea Chris Williams the drug rep for [facility] said we should not be getting Pa's for this medication with the coupon cards so he is going to stop by on Monday and actually go to the pharmacy to show them how to enter the medication so it will be covered. - CF

## 2015-03-10 NOTE — Telephone Encounter (Signed)
[facility]'s [facility] a PA. The left the number to call  [phone] and pt's ID # [phone]

## 2015-03-21 ENCOUNTER — Ambulatory Visit (INDEPENDENT_AMBULATORY_CARE_PROVIDER_SITE_OTHER): Payer: 59 | Admitting: Family Medicine

## 2015-03-21 VITALS — BP 124/85 | HR 84 | Wt 232.0 lb

## 2015-03-21 DIAGNOSIS — E291 Testicular hypofunction: Secondary | ICD-10-CM

## 2015-03-21 MED ORDER — TESTOSTERONE CYPIONATE 200 MG/ML IM SOLN
200.0000 mg | Freq: Once | INTRAMUSCULAR | Status: AC
  Administered 2015-03-21: 200 mg via INTRAMUSCULAR

## 2015-03-21 NOTE — Progress Notes (Signed)
   Subjective:    Patient ID: Shane Williams, male    DOB: 15-Aug-1971, 44 y.o.   MRN: [id number]  HPI  Patient came into office today for testosterone injection. Denies chest pain, shortness of breath, headaches and problems associated with taking this medication. Patient states he has had no abnornal mood swings. Patient questioned what the status was on the Rx for Cialis he received at this last visit. Advised Pt it was sent to [facility] and I would call and check the status on it. Patient inquired if we had any samples, gave him 1 sample box (3 pills total) to help until I can figure out what is holding up his Rx.   Review of Systems     Objective:   Physical Exam        Assessment & Plan:  Patient tolerated injection in ROUQ well without complications. Patient advised to schedule his next injection for 3 weeks from today.

## 2015-04-04 ENCOUNTER — Ambulatory Visit: Payer: 59

## 2015-04-06 ENCOUNTER — Ambulatory Visit (INDEPENDENT_AMBULATORY_CARE_PROVIDER_SITE_OTHER): Payer: 59 | Admitting: Sports Medicine

## 2015-04-06 ENCOUNTER — Telehealth: Payer: Self-pay | Admitting: Family Medicine

## 2015-04-06 VITALS — BP 121/79 | HR 97 | Wt 235.0 lb

## 2015-04-06 DIAGNOSIS — E291 Testicular hypofunction: Secondary | ICD-10-CM | POA: Diagnosis not present

## 2015-04-06 DIAGNOSIS — R7989 Other specified abnormal findings of blood chemistry: Secondary | ICD-10-CM

## 2015-04-06 MED ORDER — TESTOSTERONE CYPIONATE 200 MG/ML IM SOLN
200.0000 mg | Freq: Once | INTRAMUSCULAR | Status: AC
  Administered 2015-04-06: 200 mg via INTRAMUSCULAR

## 2015-04-06 NOTE — Assessment & Plan Note (Signed)
Testosterone injection as above. 

## 2015-04-06 NOTE — Telephone Encounter (Signed)
[employer] company regarding Cialis, Rx requries PA. Requested that information be faxed so we can begin this process.

## 2015-04-06 NOTE — Progress Notes (Signed)
   Subjective:    Patient ID: Shane Williams, male    DOB: 04-15-1971, 44 y.o.   MRN: [id number]  HPI Patient came into office today for testosterone injection. Denies chest pain, shortness of breath, headaches and problems associated with taking this medication. Patient states he has had no abnornal mood swings. Pt inquired about his Cialis Rx, advised I would contact [facility] to check on status.    Review of Systems     Objective:   Physical Exam        Assessment & Plan:  Patient tolerated injection in LOUQ well without complications. Patient advised to schedule his next injection for 2 weeks from today. [employer] company regarding Cialis, Rx requries PA. Requested that information be faxed so we can begin this process.

## 2015-04-07 NOTE — Telephone Encounter (Signed)
Information faxed to [facility] for PA.

## 2015-04-11 ENCOUNTER — Telehealth: Payer: Self-pay | Admitting: Family Medicine

## 2015-04-11 DIAGNOSIS — N529 Male erectile dysfunction, unspecified: Secondary | ICD-10-CM

## 2015-04-11 MED ORDER — SILDENAFIL CITRATE 20 MG PO TABS: ORAL_TABLET | ORAL | Status: DC

## 2015-04-11 NOTE — Telephone Encounter (Signed)
Shane Williams, Will you please let patient know that Shane Williams has refused to cover any ED medications for him because he does not have peripheral vascular disease or severe nerve damage causing his ED.  Another option would be to get a generic form of Viagra from [facility] in [place] which is much more reasonable than paying full price for any  Brand name ED mediation.  Rx in your inbox if he is interested.

## 2015-04-11 NOTE — Telephone Encounter (Signed)
Received fax from [facility] and they denied coverage on Cialis 20 mg due to not meeting [facility] medical necessity. -  CF

## 2015-04-11 NOTE — Telephone Encounter (Signed)
Left message for patient to return call.

## 2015-04-12 NOTE — Telephone Encounter (Signed)
Pt notified this am by tammy and was given the rx

## 2015-04-20 ENCOUNTER — Ambulatory Visit (INDEPENDENT_AMBULATORY_CARE_PROVIDER_SITE_OTHER): Payer: BLUE CROSS/BLUE SHIELD | Admitting: Family Medicine

## 2015-04-20 VITALS — BP 109/66 | HR 99 | Wt 233.0 lb

## 2015-04-20 DIAGNOSIS — E291 Testicular hypofunction: Secondary | ICD-10-CM | POA: Diagnosis not present

## 2015-04-20 MED ORDER — TESTOSTERONE CYPIONATE 200 MG/ML IM SOLN
200.0000 mg | Freq: Once | INTRAMUSCULAR | Status: AC
  Administered 2015-04-20: 200 mg via INTRAMUSCULAR

## 2015-04-20 NOTE — Progress Notes (Signed)
   Subjective:    Patient ID: Shane Williams, male    DOB: 1971-09-28, 44 y.o.   MRN: [id number]  HPI  Patient is here today for a testosterone injection. Denies chest pain, SOB, headaches, and problems with medication or mood changes.   Review of Systems     Objective:   Physical Exam        Assessment & Plan:  Patient tolerated injection well in the RUOQ without complications. Patient advised to schedule next injection in 14 days. Patient also mentioned that he is having a problem with his rx for sildenafil 20 mg tablet. Says that it is not working and causing a headaches. Please advise on this. Thank you.

## 2015-05-05 ENCOUNTER — Ambulatory Visit (INDEPENDENT_AMBULATORY_CARE_PROVIDER_SITE_OTHER): Payer: BLUE CROSS/BLUE SHIELD | Admitting: Family Medicine

## 2015-05-05 VITALS — BP 124/64 | HR 89 | Wt 236.0 lb

## 2015-05-05 DIAGNOSIS — E291 Testicular hypofunction: Secondary | ICD-10-CM

## 2015-05-05 MED ORDER — TADALAFIL 20 MG PO TABS: 20.0000 mg | ORAL_TABLET | Freq: Every day | ORAL | Status: DC | PRN

## 2015-05-05 MED ORDER — TESTOSTERONE CYPIONATE 200 MG/ML IM SOLN
200.0000 mg | Freq: Once | INTRAMUSCULAR | Status: AC
  Administered 2015-05-05: 200 mg via INTRAMUSCULAR

## 2015-05-05 NOTE — Progress Notes (Signed)
   Subjective:    Patient ID: Shane Williams, male    DOB: 23-Apr-1971, 44 y.o.   MRN: [id number]  HPI  Patient came into office today for testosterone injection. Denies chest pain, shortness of breath, headaches and problems associated with taking this medication. Patient states he has had no abnornal mood swings. Patient provided the front office with his new insurance card today, would like to try and get his Cialis Rx approved through this new insurance. We unsuccessfully tried to get this Rx approved through his old insurance due to side effects from Viagra. With new insurance it may get approved, will route to PCP for review.  Review of Systems     Objective:   Physical Exam        Assessment & Plan:  Patient tolerated injection in LOUQ well without complications. Patient advised to schedule his next injection for 3 weeks from today.

## 2015-05-05 NOTE — Progress Notes (Signed)
[name], I'm not sure which pharmacy he wants to use, can you ask him and then fax it to his preferred pharmacy? I'll leave the Rx on your desk.

## 2015-05-19 ENCOUNTER — Ambulatory Visit (INDEPENDENT_AMBULATORY_CARE_PROVIDER_SITE_OTHER): Payer: BLUE CROSS/BLUE SHIELD | Admitting: Family Medicine

## 2015-05-19 VITALS — BP 134/82 | HR 88 | Wt 234.0 lb

## 2015-05-19 DIAGNOSIS — E291 Testicular hypofunction: Secondary | ICD-10-CM

## 2015-05-19 MED ORDER — TESTOSTERONE CYPIONATE 200 MG/ML IM SOLN
200.0000 mg | Freq: Once | INTRAMUSCULAR | Status: AC
Start: 2015-05-19 — End: 2015-05-19
  Administered 2015-05-19: 200 mg via INTRAMUSCULAR

## 2015-05-19 NOTE — Progress Notes (Signed)
   Subjective:    Patient ID: Shane Williams, male    DOB: [birth year], 44 y.o.   MRN: [id number]  HPI  Patient came into office today for testosterone injection. Denies chest pain, shortness of breath, headaches and problems associated with taking this medication. Patient states he has had no abnornal mood swings. Patient is still trying to get approval on Cialis Rx, so far it has not been successful.   Review of Systems     Objective:   Physical Exam        Assessment & Plan:  Patient tolerated injection in ROUQ well without complications. Patient advised to schedule his next injection for 2 weeks from today.

## 2015-05-29 ENCOUNTER — Ambulatory Visit: Payer: 59 | Admitting: Family Medicine

## 2015-06-02 ENCOUNTER — Ambulatory Visit (INDEPENDENT_AMBULATORY_CARE_PROVIDER_SITE_OTHER): Payer: BLUE CROSS/BLUE SHIELD | Admitting: Family Medicine

## 2015-06-02 VITALS — BP 126/79 | HR 93 | Wt 235.0 lb

## 2015-06-02 DIAGNOSIS — E291 Testicular hypofunction: Secondary | ICD-10-CM | POA: Diagnosis not present

## 2015-06-02 MED ORDER — TESTOSTERONE CYPIONATE 200 MG/ML IM SOLN
200.0000 mg | Freq: Once | INTRAMUSCULAR | Status: AC
  Administered 2015-06-02: 200 mg via INTRAMUSCULAR

## 2015-06-02 NOTE — Progress Notes (Signed)
   Subjective:    Patient ID: Shane Williams, male    DOB: 11/28/1970, 44 y.o.   MRN: [id number]  HPI  Patient came into office today for testosterone injection. Denies chest pain, shortness of breath, headaches and problems associated with taking this medication. Patient states he has had no abnornal mood swings. Pt does state he went to the pharmacy to get his Cialis Rx and they said it would be around $400 for a one month supply. I will contact the pharmacy and see if we can do a PA to assist with this.   Review of Systems     Objective:   Physical Exam        Assessment & Plan:  Patient tolerated injection LOUQ well without complications. Patient advised to schedule his next injection for 2 weeks from today.

## 2015-06-16 ENCOUNTER — Ambulatory Visit (INDEPENDENT_AMBULATORY_CARE_PROVIDER_SITE_OTHER): Payer: BLUE CROSS/BLUE SHIELD | Admitting: Family Medicine

## 2015-06-16 VITALS — BP 111/62 | HR 87 | Wt 233.0 lb

## 2015-06-16 DIAGNOSIS — E291 Testicular hypofunction: Secondary | ICD-10-CM

## 2015-06-16 MED ORDER — TESTOSTERONE CYPIONATE 200 MG/ML IM SOLN
200.0000 mg | Freq: Once | INTRAMUSCULAR | Status: AC
  Administered 2015-06-16: 200 mg via INTRAMUSCULAR

## 2015-06-16 NOTE — Progress Notes (Signed)
   Subjective:    Patient ID: Shane Williams, male    DOB: 02/24/1971, 44 y.o.   MRN: [id number]  HPI  Shane Williams is here for a testosterone injection. Denies chest pain, shortness of breath, headaches or mood changes.   Review of Systems     Objective:   Physical Exam        Assessment & Plan:  Patient tolerated injection well without complications. Patient advised to schedule next injection 14 days from today.  

## 2015-06-21 ENCOUNTER — Telehealth: Payer: Self-pay | Admitting: Family Medicine

## 2015-06-21 NOTE — Telephone Encounter (Signed)
Resubmitted information to plan for PA- Cialis.

## 2015-06-22 NOTE — Telephone Encounter (Signed)
Kelsi, Will you please let patient know that I want to write a letter of medical necessity for him but need to know how long he has had type 2 diabetes and what medications he's tried for erectile dysfunction.

## 2015-06-22 NOTE — Telephone Encounter (Signed)
[facility] phone: [phone].

## 2015-06-22 NOTE — Telephone Encounter (Signed)
[facility], there is no Prior Auth required for this drug due to the Rx falling under a plan exclusion Rx. The last attempt for approval would be a letter of medical necessity. This would need to be faxed to [facility] at [phone], Attn: [id number].  Called pharmacy and ran discount coupon to see what OOP would be, it would be $553/10pills. Called and spoke with Pt, advised where we stand currently on Rx.

## 2015-06-22 NOTE — Telephone Encounter (Signed)
Pt states he was diagnosed with diabetes 5 years ago, he has only tried the Viagra and Cialis. The Viagra gave him headaches, the Cialis had no negative side effects.

## 2015-07-04 ENCOUNTER — Ambulatory Visit (INDEPENDENT_AMBULATORY_CARE_PROVIDER_SITE_OTHER): Payer: BLUE CROSS/BLUE SHIELD | Admitting: Family Medicine

## 2015-07-04 VITALS — BP 127/79 | HR 96 | Wt 232.0 lb

## 2015-07-04 DIAGNOSIS — E291 Testicular hypofunction: Secondary | ICD-10-CM | POA: Diagnosis not present

## 2015-07-04 MED ORDER — TESTOSTERONE CYPIONATE 200 MG/ML IM SOLN
200.0000 mg | Freq: Once | INTRAMUSCULAR | Status: AC
  Administered 2015-07-04: 200 mg via INTRAMUSCULAR

## 2015-07-04 NOTE — Progress Notes (Signed)
   Subjective:    Patient ID: Shane Williams, male    DOB: [birth year], 44 y.o.   MRN: [id number]  HPI  Patient came into office today for testosterone injection. Denies chest pain, shortness of breath, headaches and problems associated with taking this medication. Patient states he has had no abnornal mood swings. Pt does want to see if PCP has written his letter of appeal for his Cialis Rx yet, advised I would route his encounter today to PCP for review and clarification.   Review of Systems     Objective:   Physical Exam        Assessment & Plan:  Patient tolerated injection in LOUQ well without complications. Patient advised to schedule his next injection for 2 weeks from today.

## 2015-07-04 NOTE — Telephone Encounter (Signed)
Sue Lush, Can you please fax this letter (your in box) too the number on the 06/22/15 reply from [facility]?

## 2015-07-04 NOTE — Telephone Encounter (Signed)
Letter faxed.

## 2015-07-14 NOTE — Telephone Encounter (Signed)
Appeal form and letter faxed.

## 2015-07-18 ENCOUNTER — Ambulatory Visit (INDEPENDENT_AMBULATORY_CARE_PROVIDER_SITE_OTHER): Payer: BLUE CROSS/BLUE SHIELD | Admitting: Family Medicine

## 2015-07-18 VITALS — BP 127/84 | HR 93 | Wt 234.0 lb

## 2015-07-18 DIAGNOSIS — E291 Testicular hypofunction: Secondary | ICD-10-CM | POA: Diagnosis not present

## 2015-07-18 MED ORDER — TESTOSTERONE CYPIONATE 200 MG/ML IM SOLN
200.0000 mg | Freq: Once | INTRAMUSCULAR | Status: AC
Start: 2015-07-18 — End: 2015-07-18
  Administered 2015-07-18: 200 mg via INTRAMUSCULAR

## 2015-07-18 NOTE — Progress Notes (Signed)
   Subjective:    Patient ID: Shane Williams, male    DOB: 05/23/1971, 44 y.o.   MRN: [id number]  HPI  Shane Williams is here for a testosterone injection. Denies chest pain, shortness of breath, headaches or mood changes.   Review of Systems     Objective:   Physical Exam        Assessment & Plan:  Patient tolerated injection well without complications. Patient advised to schedule next injection 14 days from today.  

## 2015-07-26 ENCOUNTER — Telehealth: Payer: Self-pay | Admitting: Family Medicine

## 2015-07-26 NOTE — Telephone Encounter (Signed)
Cell not correct or not working; left message on home # to call back

## 2015-07-26 NOTE — Telephone Encounter (Signed)
Sue Lush, Will you please let Shane Williams know that the appeal I wrote for his Cialis was denied.  His insurance is not going to budge on coverage for this.

## 2015-08-01 ENCOUNTER — Ambulatory Visit (INDEPENDENT_AMBULATORY_CARE_PROVIDER_SITE_OTHER): Payer: BLUE CROSS/BLUE SHIELD | Admitting: Sports Medicine

## 2015-08-01 VITALS — BP 134/86 | HR 106

## 2015-08-01 DIAGNOSIS — E291 Testicular hypofunction: Secondary | ICD-10-CM | POA: Diagnosis not present

## 2015-08-01 DIAGNOSIS — N529 Male erectile dysfunction, unspecified: Secondary | ICD-10-CM

## 2015-08-01 MED ORDER — TESTOSTERONE CYPIONATE 200 MG/ML IM SOLN
200.0000 mg | Freq: Once | INTRAMUSCULAR | Status: AC
  Administered 2015-08-01: 200 mg via INTRAMUSCULAR

## 2015-08-01 MED ORDER — AVANAFIL 200 MG PO TABS: ORAL_TABLET | ORAL | Status: DC

## 2015-08-01 NOTE — Progress Notes (Signed)
   Subjective:    Patient ID: Shane Williams, male    DOB: [birth year], 44 y.o.   MRN: [id number]  HPI  Patient came into office today for testosterone injection. Denies chest pain, shortness of breath, headaches and problems associated with taking this medication. Patient states he has had no abnornal mood swings. Pt also has been denied multiple times for Cialis even though Viagra give him headaches. Regardless, Pt would like to get an Rx sent to [facility] for the Viagra alternative. He would also like to know if something could be sent with it to prevent headaches of if there is an OTC recommendation that would work as effectively.   Review of Systems     Objective:   Physical Exam        Assessment & Plan:  Patient tolerated injection in LOUQ well without complications. Patient advised to schedule his next injection for 2 weeks from today.

## 2015-08-01 NOTE — Telephone Encounter (Signed)
Letter mailed

## 2015-08-01 NOTE — Assessment & Plan Note (Signed)
We are going to try stendra with coupon.

## 2015-08-02 NOTE — Progress Notes (Signed)
Pt advised of new Rx, will come in for pick up.

## 2015-08-16 ENCOUNTER — Ambulatory Visit: Payer: BLUE CROSS/BLUE SHIELD

## 2015-08-17 ENCOUNTER — Ambulatory Visit (INDEPENDENT_AMBULATORY_CARE_PROVIDER_SITE_OTHER): Payer: BLUE CROSS/BLUE SHIELD | Admitting: Family Medicine

## 2015-08-17 VITALS — BP 124/87 | HR 98

## 2015-08-17 DIAGNOSIS — E291 Testicular hypofunction: Secondary | ICD-10-CM | POA: Diagnosis not present

## 2015-08-17 MED ORDER — TESTOSTERONE CYPIONATE 200 MG/ML IM SOLN
200.0000 mg | Freq: Once | INTRAMUSCULAR | Status: AC
  Administered 2015-08-17: 200 mg via INTRAMUSCULAR

## 2015-08-17 NOTE — Progress Notes (Signed)
   Subjective:    Patient ID: Shane Williams, male    DOB: November 27, 1970, 44 y.o.   MRN: [id number]  HPI  Shane CocaJoseph Sweeny is here for a testosterone injection. Denies chest pain, shortness of breath, headaches or mood changes.   Review of Systems     Objective:   Physical Exam        Assessment & Plan:  Patient tolerated injection well without complications. Patient advised to schedule next injection 14 days from today.

## 2015-08-30 ENCOUNTER — Ambulatory Visit (INDEPENDENT_AMBULATORY_CARE_PROVIDER_SITE_OTHER): Payer: BLUE CROSS/BLUE SHIELD | Admitting: Family Medicine

## 2015-08-30 VITALS — BP 117/71 | HR 92 | Resp 16 | Wt 228.0 lb

## 2015-08-30 DIAGNOSIS — E291 Testicular hypofunction: Secondary | ICD-10-CM

## 2015-08-30 MED ORDER — TESTOSTERONE CYPIONATE 200 MG/ML IM SOLN
200.0000 mg | INTRAMUSCULAR | Status: DC
  Administered 2015-08-30: 200 mg via INTRAMUSCULAR

## 2015-08-30 NOTE — Progress Notes (Signed)
   Subjective:    Patient ID: Shane CocaJoseph Rathert, male    DOB: 1971/08/11, [age] y.o.   MRN: [id number]  HPI Patient here for a testosterone injection. Denies chest pain, shortness of breath, headaches and problems with medication or mood changes.   Review of Systems     Objective:   Physical Exam        Assessment & Plan:  Patient tolerated injection well without  Complications. Patient advised to schedule next injection in 14 days.

## 2015-09-12 ENCOUNTER — Encounter: Payer: Self-pay | Admitting: Family Medicine

## 2015-09-12 ENCOUNTER — Ambulatory Visit (INDEPENDENT_AMBULATORY_CARE_PROVIDER_SITE_OTHER): Payer: BLUE CROSS/BLUE SHIELD | Admitting: Family Medicine

## 2015-09-12 DIAGNOSIS — E291 Testicular hypofunction: Secondary | ICD-10-CM

## 2015-09-12 NOTE — Progress Notes (Signed)
   Subjective:    Patient ID: Shane Williams, male    DOB: 03/02/1971, 44 y.o.   MRN: [id number]  HPI    Review of Systems     Objective:   Physical Exam        Assessment & Plan:  Patient missed appointment for testosterone injection today; noted he needs annual testosterone level checked and lab request printed. pak

## 2015-10-25 ENCOUNTER — Other Ambulatory Visit: Payer: Self-pay | Admitting: Family Medicine

## 2015-10-31 ENCOUNTER — Other Ambulatory Visit: Payer: Self-pay

## 2015-10-31 ENCOUNTER — Ambulatory Visit (INDEPENDENT_AMBULATORY_CARE_PROVIDER_SITE_OTHER): Payer: BLUE CROSS/BLUE SHIELD | Admitting: Family Medicine

## 2015-10-31 ENCOUNTER — Encounter: Payer: Self-pay | Admitting: Family Medicine

## 2015-10-31 VITALS — BP 128/80 | HR 90 | Wt 233.0 lb

## 2015-10-31 DIAGNOSIS — M7711 Lateral epicondylitis, right elbow: Secondary | ICD-10-CM | POA: Diagnosis not present

## 2015-10-31 DIAGNOSIS — E291 Testicular hypofunction: Secondary | ICD-10-CM

## 2015-10-31 DIAGNOSIS — E119 Type 2 diabetes mellitus without complications: Secondary | ICD-10-CM

## 2015-10-31 LAB — POCT GLYCOSYLATED HEMOGLOBIN (HGB A1C): Hemoglobin A1C: 8.6

## 2015-10-31 MED ORDER — TESTOSTERONE CYPIONATE 200 MG/ML IM SOLN
200.0000 mg | Freq: Once | INTRAMUSCULAR | Status: AC
  Administered 2015-10-31: 200 mg via INTRAMUSCULAR

## 2015-10-31 MED ORDER — DAPAGLIFLOZIN PRO-METFORMIN ER 5-1000 MG PO TB24: 5.0000 mg | ORAL_TABLET | Freq: Two times a day (BID) | ORAL | Status: DC

## 2015-10-31 MED ORDER — XIGDUO XR 5-1000 MG PO TB24: 5.0000 mg | ORAL_TABLET | Freq: Two times a day (BID) | ORAL | Status: DC

## 2015-10-31 NOTE — Addendum Note (Signed)
Addended by: Thom ChimesHENRY, Kassidee Narciso M on: 10/31/2015 10:49 AM   Modules accepted: Orders

## 2015-10-31 NOTE — Progress Notes (Signed)
CC: Shane Williams is a 45 y.o. male is here for Hyperglycemia   Subjective: HPI:  Follow-up type 2 diabetes: He is taking 1 pill of Xigduo on a daily basis, if he takes one twice a day he gets a sensation of low blood sugars described as fatigue. He states blood sugars are looking good at home but has no  Quantitative numbers today. He denies polyuria polyphagia or polydipsia.  Over the weekend he began to have right elbow pain. It's localized the lateral aspect of the elbow. It radiates down the forearm. It's worse with any extension of the wrist or using the wrist below the waist. He denies any recent or remote injury. There's been no swelling redness or warmth at the elbow.   Review Of Systems Outlined In HPI  Past Medical History  Diagnosis Date  . Low testosterone 11/27/2012  . Type 2 diabetes mellitus (HCC) 11/27/2012    No past surgical history on file. Family History  Problem Relation Age of Onset  . Diabetes Father     Social History   Social History  . Marital Status: Married    Spouse Name: N/A  . Number of Children: N/A  . Years of Education: N/A   Occupational History  . Not on file.   Social History Main Topics  . Smoking status: Current Every Day Smoker -- 0.25 packs/day  . Smokeless tobacco: Not on file  . Alcohol Use: Yes  . Drug Use: No  . Sexual Activity: Not on file   Other Topics Concern  . Not on file   Social History Narrative     Objective: BP 128/80 mmHg  Pulse 90  Wt 233 lb (105.688 kg)  General: Alert and Oriented, No Acute Distress HEENT: Pupils equal, round, reactive to light. Conjunctivae clear.  Moist mucous membranes Lungs: Clear to auscultation bilaterally, no wheezing/ronchi/rales.  Comfortable work of breathing. Good air movement. Cardiac: Regular rate and rhythm. Normal S1/S2.  No murmurs, rubs, nor gallops.   Extremities: No peripheral edema.  Strong peripheral pulses. Elbow pain is reproduced with resisted extension of the wrist  and resisted supination of the wrist. Mental Status: No depression, anxiety, nor agitation. Skin: Warm and dry.  Assessment & Plan: Shane Williams was seen today for hyperglycemia.  Diagnoses and all orders for this visit:  Type 2 diabetes mellitus without complication, without long-term current use of insulin (HCC)  Tennis elbow, right  Other orders -     Dapagliflozin-Metformin HCl ER (XIGDUO XR) 02-999 MG TB24; Take 5-1,000 mg by mouth 2 (two) times daily. Evening dose can be cut to half a tablet if needed.  type 2 diabetes: A1c 8.6, uncontrolled chronic condition increasing Xigduo Tennis elbow: He was given a home rehabilitation exercise program and was prepared that it may take up to 2 weeks before he starts to feel better. Here he has a brace that he is using correctly.  He declines flu shot   Return in about 3 months (around 01/29/2016).

## 2015-10-31 NOTE — Addendum Note (Signed)
Addended by: Thom ChimesHENRY, Xsavier Seeley M on: 10/31/2015 10:59 AM   Modules accepted: Orders

## 2015-11-14 ENCOUNTER — Ambulatory Visit (INDEPENDENT_AMBULATORY_CARE_PROVIDER_SITE_OTHER): Payer: BLUE CROSS/BLUE SHIELD | Admitting: Family Medicine

## 2015-11-14 VITALS — BP 142/84 | HR 102 | Ht 69.0 in | Wt 231.0 lb

## 2015-11-14 DIAGNOSIS — E291 Testicular hypofunction: Secondary | ICD-10-CM | POA: Diagnosis not present

## 2015-11-14 MED ORDER — TESTOSTERONE CYPIONATE 200 MG/ML IM SOLN
200.0000 mg | INTRAMUSCULAR | Status: DC
  Administered 2015-11-14: 200 mg via INTRAMUSCULAR

## 2015-11-14 NOTE — Progress Notes (Signed)
Will need testosterone and supplies if labwork is normal.

## 2015-11-14 NOTE — Progress Notes (Signed)
Patient was in office today for testosterone injection. 200 mg of Depo Testosterone was given RUOQ. Patient denied any headaches, dizziness or shortness of breath. Patient went down to lab and had Testosterone levels checked on today and he has requested a Rx for Testosterone and syringes and needles to do self administer at home. Patient stated that he had the self administering teaching with nurse Evoni on 10/30/2014 visit with PCP.please advise patient uses [facility]. Wymon Swaney,CMA

## 2015-11-15 ENCOUNTER — Telehealth: Payer: Self-pay | Admitting: Family Medicine

## 2015-11-15 LAB — TESTOSTERONE: Testosterone: 266 ng/dL (ref 250–827)

## 2015-11-15 MED ORDER — AMBULATORY NON FORMULARY MEDICATION: Status: DC

## 2015-11-15 MED ORDER — TESTOSTERONE CYPIONATE 200 MG/ML IM SOLN: 200.0000 mg | INTRAMUSCULAR | Status: DC

## 2015-11-15 NOTE — Telephone Encounter (Signed)
Pt.notified

## 2015-11-15 NOTE — Telephone Encounter (Signed)
Will you please let patient know that his testosterone level was in the normal range so I"ll print off a Rx for testosterone and supplies.

## 2015-12-15 ENCOUNTER — Emergency Department (INDEPENDENT_AMBULATORY_CARE_PROVIDER_SITE_OTHER)
Admission: EM | Admit: 2015-12-15 | Discharge: 2015-12-15 | Disposition: A | Discharge status: Home / Self Care | Payer: BLUE CROSS/BLUE SHIELD | Source: Home / Self Care | Attending: Family Medicine | Admitting: Family Medicine

## 2015-12-15 DIAGNOSIS — R0981 Nasal congestion: Secondary | ICD-10-CM | POA: Diagnosis not present

## 2015-12-15 DIAGNOSIS — R6889 Other general symptoms and signs: Secondary | ICD-10-CM | POA: Diagnosis not present

## 2015-12-15 LAB — POCT INFLUENZA A/B
Influenza A, POC: NEGATIVE
Influenza B, POC: NEGATIVE

## 2015-12-15 MED ORDER — OSELTAMIVIR PHOSPHATE 75 MG PO CAPS: 75.0000 mg | ORAL_CAPSULE | Freq: Two times a day (BID) | ORAL | Status: DC

## 2015-12-15 NOTE — ED Notes (Signed)
Started yesterday morning with head congestion, runny nose, eyes watering, pressure above and below the eyes, headache across forehead. Daughter tested positive for the flu earlier in the week.

## 2015-12-15 NOTE — ED Provider Notes (Signed)
CSN: [id number]     Arrival date & time 12/15/15  1526 History   First MD Initiated Contact with Patient 12/15/15 1538     Chief Complaint  Patient presents with  . Nasal Congestion   (Consider location/radiation/quality/duration/timing/severity/associated sxs/prior Treatment) HPI The pt is a 45yo male presenting to [facility] with c/o nasal congestion, rhinorrhea, and pressure above his eyes and across his forehead.  Body aches and fatigue are most bothersome for the pt.  Pt states his daughter tested positive for the flu this week and his other daughter became sick yesterday. She was treated empirically for the flu due to recent diagnosis of her sister.  Pt denies fever, chills, n/v/d. Denies cough, chest pain or SOB. Denies hx of asthma. He has been taking Dayquil, last dose was early this morning.   Past Medical History  Diagnosis Date  . Low testosterone 11/27/2012  . Type 2 diabetes mellitus (HCC) 11/27/2012   Past Surgical History  Procedure Laterality Date  . Appendectomy    . Shoulder arthroscopy    . Incise and drain abcess      4 times in last 2 years   Family History  Problem Relation Age of Onset  . Diabetes Father    Social History  Substance Use Topics  . Smoking status: Current Every Day Smoker -- 0.25 packs/day  . Smokeless tobacco: None  . Alcohol Use: Yes    Review of Systems  Constitutional: Positive for fatigue. Negative for fever and chills.  HENT: Positive for congestion, rhinorrhea, sinus pressure and sore throat ( scratchy). Negative for ear pain, trouble swallowing and voice change.   Respiratory: Positive for cough ( minimal). Negative for shortness of breath.   Cardiovascular: Negative for chest pain and palpitations.  Gastrointestinal: Negative for nausea, vomiting, abdominal pain and diarrhea.  Musculoskeletal: Positive for myalgias and arthralgias. Negative for back pain.  Skin: Negative for rash.  Neurological: Positive for headaches. Negative for  dizziness and light-headedness.    Allergies  Penicillins and Viagra  Home Medications   Prior to Admission medications   Medication Sig Start Date End Date Taking? Authorizing Provider  AMBULATORY NON FORMULARY MEDICATION One Touch Ultra Blue Test Strips.  Diagnosis: Diabetes Mellitus. Test 2 times a day 12/29/12   Laren Boom, DO  AMBULATORY NON FORMULARY MEDICATION 3mL syringes, 18G needle (draw), and 22G 1-1/2" needle for injecting testosterone. 11/15/15   Laren Boom, DO  Dapagliflozin-Metformin HCl ER (XIGDUO XR) 02-999 MG TB24 Take 5-1,000 mg by mouth 2 (two) times daily. Evening dose can be cut to half a tablet if needed. 10/31/15   Laren Boom, DO  Lancets (ONETOUCH ULTRASOFT) lancets Use as directed 12/08/12   Laren Boom, DO  oseltamivir (TAMIFLU) 75 MG capsule Take 1 capsule (75 mg total) by mouth every 12 (twelve) hours. 12/15/15   Junius Finner, PA-C  testosterone cypionate (DEPOTESTOSTERONE CYPIONATE) 200 MG/ML injection Inject 1 mL (200 mg total) into the muscle every 14 (fourteen) days. 11/15/15   Laren Boom, DO   Meds Ordered and Administered this Visit  Medications - No data to display  BP 131/81 mmHg  Pulse 106  Temp(Src) 98.6 F (37 C) (Oral)  Ht  (1.753 m)  Wt 235 lb (106.595 kg)  BMI 34.69 kg/m2  SpO2 96% No data found.   Physical Exam  Constitutional: He appears well-developed and well-nourished.  Pt lying on exam table, appears fatigued but is alert and cooperative during exam  HENT:  Head: Normocephalic and atraumatic.  Right Ear: Tympanic membrane normal.  Left Ear: Tympanic membrane normal.  Nose: Rhinorrhea present.  Mouth/Throat: Uvula is midline, oropharynx is clear and moist and mucous membranes are normal.  Eyes: Conjunctivae are normal. No scleral icterus.  Neck: Normal range of motion. Neck supple.  Cardiovascular: Regular rhythm and normal heart sounds.  Tachycardia present.   Mild tachycardia, normal rate  Pulmonary/Chest: Effort normal  and breath sounds normal. No stridor. No respiratory distress. He has no wheezes. He has no rales. He exhibits no tenderness.  Abdominal: Soft. He exhibits no distension. There is no tenderness.  Musculoskeletal: Normal range of motion.  Lymphadenopathy:    He has no cervical adenopathy.  Neurological: He is alert.  Skin: Skin is warm and dry.  Nursing note and vitals reviewed.   ED Course  Procedures (including critical care time)  Labs Review Labs Reviewed  POCT INFLUENZA A/B    Imaging Review No results found.    MDM   1. Sinus congestion   2. Flu-like symptoms    Pt c/o flu-like symptoms that started yesterday.  His daughter was recently diagnosed with the flu and his other daughter started getting symptoms as well.    Rapid flu: negative, but due to recent sick contacts with flu, will treat empirically  Rx: Tamiflu.  Pt states cough is not very bothersome at this time.  Advised pt to use acetaminophen and ibuprofen as needed for fever and pain. Encouraged rest and fluids. F/u with PCP in 7-10 days if not improving, sooner if worsening. Pt verbalized understanding and agreement with tx plan.     Junius Finner, PA-C 12/15/15 [phone]

## 2015-12-15 NOTE — Discharge Instructions (Signed)

## 2016-01-29 ENCOUNTER — Encounter: Payer: Self-pay | Admitting: Family Medicine

## 2016-01-29 ENCOUNTER — Ambulatory Visit (INDEPENDENT_AMBULATORY_CARE_PROVIDER_SITE_OTHER): Payer: BLUE CROSS/BLUE SHIELD | Admitting: Family Medicine

## 2016-01-29 DIAGNOSIS — Z0289 Encounter for other administrative examinations: Secondary | ICD-10-CM

## 2016-01-29 NOTE — Progress Notes (Signed)
No show 01/29/2016

## 2016-05-13 ENCOUNTER — Ambulatory Visit: Payer: BLUE CROSS/BLUE SHIELD | Admitting: Family Medicine

## 2016-05-15 ENCOUNTER — Ambulatory Visit (INDEPENDENT_AMBULATORY_CARE_PROVIDER_SITE_OTHER): Payer: BLUE CROSS/BLUE SHIELD | Admitting: Family Medicine

## 2016-05-15 ENCOUNTER — Encounter: Payer: Self-pay | Admitting: Family Medicine

## 2016-05-15 VITALS — BP 118/75 | HR 106 | Wt 230.0 lb

## 2016-05-15 DIAGNOSIS — E291 Testicular hypofunction: Secondary | ICD-10-CM

## 2016-05-15 DIAGNOSIS — E119 Type 2 diabetes mellitus without complications: Secondary | ICD-10-CM | POA: Diagnosis not present

## 2016-05-15 DIAGNOSIS — R7989 Other specified abnormal findings of blood chemistry: Secondary | ICD-10-CM

## 2016-05-15 LAB — POCT GLYCOSYLATED HEMOGLOBIN (HGB A1C): HEMOGLOBIN A1C: 9.3

## 2016-05-15 MED ORDER — TESTOSTERONE CYPIONATE 200 MG/ML IM SOLN
200.0000 mg | Freq: Once | INTRAMUSCULAR | Status: AC
  Administered 2016-05-15: 200 mg via INTRAMUSCULAR

## 2016-05-15 MED ORDER — DAPAGLIFLOZIN PRO-METFORMIN ER 5-1000 MG PO TB24: 5.0000 mg | ORAL_TABLET | Freq: Two times a day (BID) | ORAL | 5 refills | Status: DC

## 2016-05-15 NOTE — Progress Notes (Signed)
CC: Shane Williams is a 45 y.o. male is here for Diabetes   Subjective: HPI:  Follow type 2 diabetes: Taking Xigduo with 100% compliance, he felt "weird "and jittery in the middle of last week and check his blood sugar was 250. He's cut back on fruits in his diet and now fasting blood sugars are no greater than 110 and daytime sugars are around 150-159. No longer feeling the effects that he was feeling last week. He denies chest pain shortness of breath orthopnea nor peripheral edema. No motor or sensory disturbances in last week.  Follow-up hypogonadism: Has not been taking testosterone injections last 3 months because it's causing his wife anxiety when she gets the injection. He wants to start receiving them here in our office.   Review Of Systems Outlined In HPI  Past Medical History:  Diagnosis Date  . Low testosterone 11/27/2012  . Type 2 diabetes mellitus (HCC) 11/27/2012    Past Surgical History:  Procedure Laterality Date  . APPENDECTOMY    . INCISE AND DRAIN ABCESS     4 times in last 2 years  . SHOULDER ARTHROSCOPY     Family History  Problem Relation Age of Onset  . Diabetes Father     Social History   Social History  . Marital status: Married    Spouse name: N/A  . Number of children: N/A  . Years of education: N/A   Occupational History  . Not on file.   Social History Main Topics  . Smoking status: Current Every Day Smoker    Packs/day: 0.25  . Smokeless tobacco: Not on file  . Alcohol use Yes  . Drug use: No  . Sexual activity: Not on file   Other Topics Concern  . Not on file   Social History Narrative  . No narrative on file     Objective: BP 118/75 (BP Location: Left Arm, Patient Position: Sitting, Cuff Size: Normal)   Pulse (!) 106   Wt 230 lb (104.3 kg)   BMI 33.97 kg/m   Vital signs reviewed. General: Alert and Oriented, No Acute Distress HEENT: Pupils equal, round, reactive to light. Conjunctivae clear.  External ears unremarkable.   Moist mucous membranes. Lungs: Clear and comfortable work of breathing, speaking in full sentences without accessory muscle use. Cardiac: Regular rate and rhythm.  Neuro: CN II-XII grossly intact, gait normal. Extremities: No peripheral edema.  Strong peripheral pulses.  Mental Status: No depression, anxiety, nor agitation. Logical though process. Skin: Warm and dry.  Assessment & Plan: Shane Williams was seen today for diabetes.  Diagnoses and all orders for this visit:  Type 2 diabetes mellitus without complication, without long-term current use of insulin (HCC) -     POCT HgB A1C  Low testosterone -     testosterone cypionate (DEPOTESTOSTERONE CYPIONATE) injection 200 mg; Inject 1 mL (200 mg total) into the muscle once.  Other orders -     Dapagliflozin-Metformin HCl ER (XIGDUO XR) 02-999 MG TB24; Take 5-1,000 mg by mouth 2 (two) times daily. Evening dose can be cut to half a tablet if needed.   Type 2 diabetes: A1c of 9.3, uncontrolled chronic condition I've encouraged him to cut out all simple sugars and fruit in his diet. Recheck A1c in 3 months continue current dose of Xigduo, his report of 100% compliance is questionable given that his refill should have run out sometime last month. Hypogonadism: Restarting twice a month injections here.  Return in about 2 weeks (around 05/29/2016)  for Tesotsterone.  Discussed with this patient that I will be resigning from my position here with [facility] in September in order to stay with my family who will be moving to [place] [facility]. I let him know about the providers that are still accepting patients and I feel that this individual will be under great care if he/she stays here with [facility].

## 2016-05-29 ENCOUNTER — Encounter: Payer: Self-pay | Admitting: Family Medicine

## 2016-05-29 ENCOUNTER — Ambulatory Visit (INDEPENDENT_AMBULATORY_CARE_PROVIDER_SITE_OTHER): Payer: BLUE CROSS/BLUE SHIELD | Admitting: Family Medicine

## 2016-05-29 VITALS — BP 130/72 | HR 81 | Wt 235.0 lb

## 2016-05-29 DIAGNOSIS — R7989 Other specified abnormal findings of blood chemistry: Secondary | ICD-10-CM

## 2016-05-29 DIAGNOSIS — E291 Testicular hypofunction: Secondary | ICD-10-CM | POA: Diagnosis not present

## 2016-05-29 MED ORDER — TESTOSTERONE CYPIONATE 200 MG/ML IM SOLN
200.0000 mg | INTRAMUSCULAR | Status: DC
  Administered 2016-05-29: 200 mg via INTRAMUSCULAR

## 2016-05-29 NOTE — Progress Notes (Signed)
Patient here for testosterone injection. He denies headache, dizziness, mood changes, blurred vision. Patient tolerated last injection well.

## 2016-06-12 ENCOUNTER — Ambulatory Visit (INDEPENDENT_AMBULATORY_CARE_PROVIDER_SITE_OTHER): Payer: BLUE CROSS/BLUE SHIELD | Admitting: Family Medicine

## 2016-06-12 VITALS — BP 105/70 | HR 94 | Wt 232.0 lb

## 2016-06-12 DIAGNOSIS — E291 Testicular hypofunction: Secondary | ICD-10-CM

## 2016-06-12 MED ORDER — TESTOSTERONE CYPIONATE 200 MG/ML IM SOLN
200.0000 mg | Freq: Once | INTRAMUSCULAR | Status: AC
  Administered 2016-06-12: 200 mg via INTRAMUSCULAR

## 2016-06-12 NOTE — Progress Notes (Signed)
Patient came into office today for testosterone injection. Denies chest pain, shortness of breath, headaches and problems associated with taking this medication. Patient states he has had no abnornal mood swings. Patient tolerated injection in ROUQ well without complications. Patient advised to schedule his next injection for 2 weeks from today. 

## 2016-06-25 ENCOUNTER — Other Ambulatory Visit: Payer: Self-pay | Admitting: Family Medicine

## 2016-06-26 ENCOUNTER — Ambulatory Visit (INDEPENDENT_AMBULATORY_CARE_PROVIDER_SITE_OTHER): Payer: BLUE CROSS/BLUE SHIELD | Admitting: Family Medicine

## 2016-06-26 VITALS — BP 136/75 | HR 91 | Wt 235.0 lb

## 2016-06-26 DIAGNOSIS — E291 Testicular hypofunction: Secondary | ICD-10-CM

## 2016-06-26 MED ORDER — TESTOSTERONE CYPIONATE 200 MG/ML IM SOLN
200.0000 mg | Freq: Once | INTRAMUSCULAR | Status: AC
  Administered 2016-06-26: 200 mg via INTRAMUSCULAR

## 2016-06-26 NOTE — Patient Instructions (Signed)
Return in 14 days for testosterone injection. 

## 2016-06-26 NOTE — Progress Notes (Signed)
   Subjective:    Patient ID: Shane Williams, male    DOB: August 13, 1971, 45 y.o.   MRN: [id number]  HPI  Shane Williams is here for a testosterone injection. Denies chest pain, shortness of breath, headaches or mood changes.    Review of Systems     Objective:   Physical Exam        Assessment & Plan:  Patient tolerated injection well without complications. Patient advised to schedule next injection 14 days from today.

## 2016-06-27 ENCOUNTER — Other Ambulatory Visit: Payer: Self-pay | Admitting: Family Medicine

## 2016-07-10 ENCOUNTER — Ambulatory Visit (INDEPENDENT_AMBULATORY_CARE_PROVIDER_SITE_OTHER): Payer: BLUE CROSS/BLUE SHIELD | Admitting: Family Medicine

## 2016-07-10 VITALS — BP 123/79 | HR 98 | Wt 232.0 lb

## 2016-07-10 DIAGNOSIS — E291 Testicular hypofunction: Secondary | ICD-10-CM | POA: Diagnosis not present

## 2016-07-10 MED ORDER — TESTOSTERONE CYPIONATE 200 MG/ML IM SOLN
200.0000 mg | Freq: Once | INTRAMUSCULAR | Status: AC
  Administered 2016-07-10: 200 mg via INTRAMUSCULAR

## 2016-07-10 NOTE — Progress Notes (Signed)
Shane Williams presents to the clinic for a testosterone injection.  Denies chest pain, SOB, palpitations, headaches, and mood changes.  Pt tolerated injection well in the RUOQ without any complications.  Pt advised to make next appointment in 14 days. -EMH/RMA

## 2016-07-18 ENCOUNTER — Telehealth: Payer: Self-pay | Admitting: Family Medicine

## 2016-07-18 NOTE — Telephone Encounter (Signed)
Letter received from insurance company. Mr Shane Williams is due to urine micro screen.  We will try to do this as well as catch up on vaccines and DM foot exam when he return for a testosterone shot on October 4th.

## 2016-07-18 NOTE — Telephone Encounter (Signed)
Left vm for pt to call to return call to clinic in regards to lack of health maintenance

## 2016-07-24 ENCOUNTER — Ambulatory Visit (INDEPENDENT_AMBULATORY_CARE_PROVIDER_SITE_OTHER): Payer: BLUE CROSS/BLUE SHIELD | Admitting: Family Medicine

## 2016-07-24 VITALS — BP 130/76 | HR 98

## 2016-07-24 DIAGNOSIS — E291 Testicular hypofunction: Secondary | ICD-10-CM

## 2016-07-24 MED ORDER — TESTOSTERONE CYPIONATE 200 MG/ML IM SOLN
200.0000 mg | Freq: Once | INTRAMUSCULAR | Status: AC
  Administered 2016-07-24: 200 mg via INTRAMUSCULAR

## 2016-07-24 NOTE — Progress Notes (Signed)
Shane Williams presents to the clinic for his testosterone injection.  Denies chest pain, palpitation, SOB, and mood swings.  Pt tolerated injection well without any complications.  Pt stated that he will get his next injection when he comes to see Dr. Denyse Amassorey on 08/06/16. -EMH/RMA

## 2016-08-06 ENCOUNTER — Ambulatory Visit (INDEPENDENT_AMBULATORY_CARE_PROVIDER_SITE_OTHER): Payer: BLUE CROSS/BLUE SHIELD | Admitting: Family Medicine

## 2016-08-06 ENCOUNTER — Encounter: Payer: Self-pay | Admitting: Family Medicine

## 2016-08-06 VITALS — BP 115/68 | HR 87 | Wt 231.0 lb

## 2016-08-06 DIAGNOSIS — E6609 Other obesity due to excess calories: Secondary | ICD-10-CM

## 2016-08-06 DIAGNOSIS — E1165 Type 2 diabetes mellitus with hyperglycemia: Secondary | ICD-10-CM | POA: Diagnosis not present

## 2016-08-06 DIAGNOSIS — Z23 Encounter for immunization: Secondary | ICD-10-CM | POA: Diagnosis not present

## 2016-08-06 DIAGNOSIS — E291 Testicular hypofunction: Secondary | ICD-10-CM | POA: Diagnosis not present

## 2016-08-06 DIAGNOSIS — E669 Obesity, unspecified: Secondary | ICD-10-CM | POA: Insufficient documentation

## 2016-08-06 DIAGNOSIS — R7989 Other specified abnormal findings of blood chemistry: Secondary | ICD-10-CM

## 2016-08-06 DIAGNOSIS — IMO0001 Reserved for inherently not codable concepts without codable children: Secondary | ICD-10-CM

## 2016-08-06 DIAGNOSIS — E349 Endocrine disorder, unspecified: Secondary | ICD-10-CM

## 2016-08-06 DIAGNOSIS — E119 Type 2 diabetes mellitus without complications: Secondary | ICD-10-CM | POA: Diagnosis not present

## 2016-08-06 DIAGNOSIS — E785 Hyperlipidemia, unspecified: Secondary | ICD-10-CM

## 2016-08-06 DIAGNOSIS — Z6834 Body mass index (BMI) 34.0-34.9, adult: Secondary | ICD-10-CM

## 2016-08-06 LAB — POCT GLYCOSYLATED HEMOGLOBIN (HGB A1C): Hemoglobin A1C: <14

## 2016-08-06 LAB — POCT UA - MICROALBUMIN
Creatinine, POC: 100 mg/dL
Microalbumin Ur, POC: 10 mg/L

## 2016-08-06 MED ORDER — TESTOSTERONE CYPIONATE 200 MG/ML IM SOLN
200.0000 mg | Freq: Once | INTRAMUSCULAR | Status: AC
  Administered 2016-08-06: 200 mg via INTRAMUSCULAR

## 2016-08-06 NOTE — Progress Notes (Signed)
Shane Williams is a 45 y.o. male who [name] today for diabetes with testosterone.  Diabetes: Patient has a history of poorly controlled diabetes. He currently takes the medications listed below intermittently. He denies polyuria or polydipsia. He notes his typical fasting blood sugars are in the 120s.  Low testosterone: Patient has a history of low testosterone. He gets intermittent testosterone injections in the clinic every 2 weeks. He notes the testosterone has improved his libido he feels pretty well.  Hyperlipidemia: Patient has a history of hyperlipidemia. He currently does not take any medications for this.  Past Medical History:  Diagnosis Date  . Low testosterone 11/27/2012  . Type 2 diabetes mellitus (HCC) 11/27/2012   Past Surgical History:  Procedure Laterality Date  . APPENDECTOMY    . INCISE AND DRAIN ABCESS     4 times in last 2 years  . SHOULDER ARTHROSCOPY     Social History  Substance Use Topics  . Smoking status: Current Every Day Smoker    Packs/day: 0.25  . Smokeless tobacco: Not on file  . Alcohol use Yes   family history includes Diabetes in his father.  ROS as above:  Medications: Current Outpatient Prescriptions  Medication Sig Dispense Refill  . AMBULATORY NON FORMULARY MEDICATION One Touch Ultra Blue Test Strips.  Diagnosis: Diabetes Mellitus. Test 2 times a day 100 strip 4  . AMBULATORY NON FORMULARY MEDICATION 3mL syringes, 18G needle (draw), and 22G 1-1/2" needle for injecting testosterone. 10 Units 11  . CIALIS 20 MG tablet TAKE 1 TABLET BY MOUTH DAILY AS NEEDED FOR ERECTILE DYSFUNCTION 10 tablet 0  . Dapagliflozin-Metformin HCl ER (XIGDUO XR) 02-999 MG TB24 Take 5-1,000 mg by mouth 2 (two) times daily. Evening dose can be cut to half a tablet if needed. 60 tablet 5  . Lancets (ONETOUCH ULTRASOFT) lancets Use as  directed 60 each 5  . testosterone cypionate (DEPOTESTOTERONE CYPIONATE) 100 MG/ML injection Inject 200 mg into the muscle every 14 (fourteen) days. For IM use only     No current facility-administered medications for this visit.    Allergies  Allergen Reactions  . Penicillins Swelling  . Viagra [Sildenafil Citrate]     headache    Health Maintenance Health Maintenance  Topic Date Due  . HIV Screening  11/02/1984  . OPHTHALMOLOGY EXAM  12/14/2015  . INFLUENZA VACCINE  06/21/2024 (Originally 05/21/2016)  . HEMOGLOBIN A1C  02/04/2017  . FOOT EXAM  08/06/2017  . URINE MICROALBUMIN  08/06/2017  . PNEUMOCOCCAL POLYSACCHARIDE VACCINE (2) 08/06/2021  . TETANUS/TDAP  08/06/2026     Exam:  BP 115/68   Pulse 87   Wt 231 lb (104.8 kg)   BMI 34.11 kg/m  Gen: Well NAD HEENT: EOMI,  MMM Lungs: Normal work of breathing. CTABL Heart: RRR no MRG Abd: NABS, Soft. Nondistended, Nontender Exts: Brisk capillary refill, warm and well perfused.  Diabetic Foot Exam - Simple   Simple Foot Form Diabetic Foot exam was performed with the following findings:  Yes 08/06/2016  9:59 AM  Visual Inspection No deformities, no ulcerations, no other skin breakdown bilaterally:  Yes Sensation Testing Intact to touch and monofilament testing bilaterally:  Yes Pulse Check Posterior Tibialis and Dorsalis pulse intact bilaterally:  Yes Comments       Results for orders placed or performed in visit on 08/06/16 (from the past 72 hour(s))  POCT HgB A1C     Status: Abnormal  Collection Time: 08/06/16 10:38 AM  Result Value Ref Range   Hemoglobin A1C <14   POCT UA - Microalbumin     Status: Normal   Collection Time: 08/06/16 11:06 AM  Result Value Ref Range   Microalbumin Ur, POC 10 mg/L   Creatinine, POC 100 mg/dL   Albumin/Creatinine Ratio, Urine, POC <30    No results found.    Assessment and Plan: 45 y.o. male with  Diabetes: Not well-controlled per point of care A1c today. We'll recheck  actual lab A1c in about a week with the rest of the fasting labs. I suspect today's A1c probably reflects reality. I have asked Asked patient to bring a glucose log with 3 times a day testing for follow-up in one month. Likely will need to use insulin.  Lipids: Fasting lipids have not been checked in some time. We'll recheck cholesterol today. Patient smokes and has poorly controlled diabetes. His Framingham risk factor score probably exceeds 10%. Likely will have to start statins.  Testosterone: Patient is doing pretty well with current testosterone management. We'll check fasting testosterone in about a week.  Orders Placed This Encounter  Procedures  . Tdap vaccine greater than or equal to 7yo IM  . Pneumococcal polysaccharide vaccine 23-valent greater than or equal to 2yo subcutaneous/IM  . CBC  . COMPLETE METABOLIC PANEL WITH GFR  . Ferritin  . Hemoglobin A1c  . Iron and TIBC  . HIV antibody  . Lipid panel  . Testosterone  . PSA  . VITAMIN D 25 Hydroxy (Vit-D Deficiency, Fractures)  . Uric acid  . POCT HgB A1C  . POCT UA - Microalbumin    Discussed warning signs or symptoms. Please see discharge instructions. Patient expresses understanding.

## 2016-08-06 NOTE — Patient Instructions (Signed)
Thank you for coming in today. Get fasting labs next week.  Return for recheck in 1 month.  Please keep a blood sugar log with 3x daily testing.  Return sooner if needed.   Blood Glucose Monitoring, Adult Monitoring your blood glucose (also know as blood sugar) helps you to manage your diabetes. It also helps you and your health care provider monitor your diabetes and determine how well your treatment plan is working. WHY SHOULD YOU MONITOR YOUR BLOOD GLUCOSE?  It can help you understand how food, exercise, and medicine affect your blood glucose.  It allows you to know what your blood glucose is at any given moment. You can quickly tell if you are having low blood glucose (hypoglycemia) or high blood glucose (hyperglycemia).  It can help you and your health care provider know how to adjust your medicines.  It can help you understand how to manage an illness or adjust medicine for exercise. WHEN SHOULD YOU TEST? Your health care provider will help you decide how often you should check your blood glucose. This may depend on the type of diabetes you have, your diabetes control, or the types of medicines you are taking. Be sure to write down all of your blood glucose readings so that this information can be reviewed with your health care provider. See below for examples of testing times that your health care provider may suggest. Type 1 Diabetes  Test at least 2 times per day if your diabetes is well controlled, if you are using an insulin pump, or if you perform multiple daily injections.  If your diabetes is not well controlled or if you are sick, you may need to test more often.  It is a good idea to also test:  Before every insulin injection.  Before and after exercise.  Between meals and 2 hours after a meal.  Occasionally between 2:00 a.m. and 3:00 a.m. Type 2 Diabetes  If you are taking insulin, test at least 2 times per day. However, it is best to test before every insulin  injection.  If you take medicines by mouth (orally), test 2 times a day.  If you are on a controlled diet, test once a day.  If your diabetes is not well controlled or if you are sick, you may need to monitor more often. HOW TO MONITOR YOUR BLOOD GLUCOSE Supplies Needed  Blood glucose meter.  Test strips for your meter. Each meter has its own strips. You must use the strips that go with your own meter.  A pricking needle (lancet).  A device that holds the lancet (lancing device).  A journal or log book to write down your results. Procedure  Wash your hands with soap and water. Alcohol is not preferred.  Prick the side of your finger (not the tip) with the lancet.  Gently milk the finger until a small drop of blood appears.  Follow the instructions that come with your meter for inserting the test strip, applying blood to the strip, and using your blood glucose meter. Other Areas to Get Blood for Testing Some meters allow you to use other areas of your body (other than your finger) to test your blood. These areas are called alternative sites. The most common alternative sites are:  The forearm.  The thigh.  The back area of the lower leg.  The palm of the hand. The blood flow in these areas is slower. Therefore, the blood glucose values you get may be delayed, and the  numbers are different from what you would get from your fingers. Do not use alternative sites if you think you are having hypoglycemia. Your reading will not be accurate. Always use a finger if you are having hypoglycemia. Also, if you cannot feel your lows (hypoglycemia unawareness), always use your fingers for your blood glucose checks. ADDITIONAL TIPS FOR GLUCOSE MONITORING  Do not reuse lancets.  Always carry your supplies with you.  All blood glucose meters have a 24-hour "hotline" number to call if you have questions or need help.  Adjust (calibrate) your blood glucose meter with a control solution  after finishing a few boxes of strips. BLOOD GLUCOSE RECORD KEEPING It is a good idea to keep a daily record or log of your blood glucose readings. Most glucose meters, if not all, keep your glucose records stored in the meter. Some meters come with the ability to download your records to your home computer. Keeping a record of your blood glucose readings is especially helpful if you are wanting to look for patterns. Make notes to go along with the blood glucose readings because you might forget what happened at that exact time. Keeping good records helps you and your health care provider to work together to achieve good diabetes management.    This information is not intended to replace advice given to you by your health care provider. Make sure you discuss any questions you have with your health care provider.   Document Released: 10/10/2003 Document Revised: 10/28/2014 Document Reviewed: 03/01/2013 Elsevier Interactive Patient Education [employer]2016 Elsevier Inc.

## 2016-08-07 ENCOUNTER — Encounter: Payer: Self-pay | Admitting: *Deleted

## 2016-08-14 DIAGNOSIS — E119 Type 2 diabetes mellitus without complications: Secondary | ICD-10-CM | POA: Diagnosis not present

## 2016-08-14 DIAGNOSIS — E291 Testicular hypofunction: Secondary | ICD-10-CM | POA: Diagnosis not present

## 2016-08-14 DIAGNOSIS — E1165 Type 2 diabetes mellitus with hyperglycemia: Secondary | ICD-10-CM | POA: Diagnosis not present

## 2016-08-14 DIAGNOSIS — E349 Endocrine disorder, unspecified: Secondary | ICD-10-CM | POA: Diagnosis not present

## 2016-08-14 LAB — CBC
HEMATOCRIT: 55.1 % — AB (ref 38.5–50.0)
HEMOGLOBIN: 18.6 g/dL — AB (ref 13.2–17.1)
MCH: 29.8 pg (ref 27.0–33.0)
MCHC: 33.8 g/dL (ref 32.0–36.0)
MCV: 88.2 fL (ref 80.0–100.0)
MPV: 9.3 fL (ref 7.5–12.5)
Platelets: 205 10*3/uL (ref 140–400)
RBC: 6.25 MIL/uL — AB (ref 4.20–5.80)
RDW: 13 % (ref 11.0–15.0)
WBC: 7.6 10*3/uL (ref 3.8–10.8)

## 2016-08-15 ENCOUNTER — Encounter: Payer: Self-pay | Admitting: Family Medicine

## 2016-08-15 DIAGNOSIS — Z9189 Other specified personal risk factors, not elsewhere classified: Secondary | ICD-10-CM | POA: Insufficient documentation

## 2016-08-15 LAB — IRON AND TIBC
%SAT: 13 % — ABNORMAL LOW (ref 15–60)
Iron: 47 ug/dL — ABNORMAL LOW (ref 50–180)
TIBC: 353 ug/dL (ref 250–425)
UIBC: 306 ug/dL (ref 125–400)

## 2016-08-15 LAB — COMPLETE METABOLIC PANEL WITH GFR
ALBUMIN: 4.3 g/dL (ref 3.6–5.1)
ALK PHOS: 54 U/L (ref 40–115)
ALT: 45 U/L (ref 9–46)
AST: 32 U/L (ref 10–40)
BILIRUBIN TOTAL: 0.7 mg/dL (ref 0.2–1.2)
BUN: 11 mg/dL (ref 7–25)
CALCIUM: 9 mg/dL (ref 8.6–10.3)
CHLORIDE: 101 mmol/L (ref 98–110)
CO2: 24 mmol/L (ref 20–31)
CREATININE: 1.02 mg/dL (ref 0.60–1.35)
GFR, Est Non African American: 88 mL/min (ref >=60)
Glucose, Bld: 127 mg/dL — ABNORMAL HIGH (ref 65–99)
Potassium: 4.2 mmol/L (ref 3.5–5.3)
Sodium: 137 mmol/L (ref 135–146)
TOTAL PROTEIN: 6.9 g/dL (ref 6.1–8.1)

## 2016-08-15 LAB — LIPID PANEL
CHOL/HDL RATIO: 6.3 ratio — AB (ref <=5.0)
CHOLESTEROL: 170 mg/dL (ref 125–200)
HDL: 27 mg/dL — ABNORMAL LOW (ref >=40)
LDL Cholesterol: 122 mg/dL (ref <130)
TRIGLYCERIDES: 104 mg/dL (ref <150)
VLDL: 21 mg/dL (ref <30)

## 2016-08-15 LAB — HEMOGLOBIN A1C
HEMOGLOBIN A1C: 8.7 % — AB (ref <5.7)
Mean Plasma Glucose: 203 mg/dL

## 2016-08-15 LAB — HIV ANTIBODY (ROUTINE TESTING W REFLEX): HIV: NONREACTIVE

## 2016-08-15 LAB — FERRITIN: FERRITIN: 124 ng/mL (ref 20–380)

## 2016-08-15 LAB — TESTOSTERONE: Testosterone: 645 ng/dL (ref 250–827)

## 2016-08-15 LAB — VITAMIN D 25 HYDROXY (VIT D DEFICIENCY, FRACTURES): Vit D, 25-Hydroxy: 25 ng/mL — ABNORMAL LOW (ref 30–100)

## 2016-08-15 LAB — URIC ACID: Uric Acid, Serum: 4.3 mg/dL (ref 4.0–8.0)

## 2016-08-15 LAB — PSA: PSA: 0.4 ng/mL (ref <=4.0)

## 2016-08-15 MED ORDER — SITAGLIPTIN PHOSPHATE 100 MG PO TABS: 100.0000 mg | ORAL_TABLET | Freq: Every day | ORAL | 2 refills | Status: DC

## 2016-08-15 MED ORDER — ATORVASTATIN CALCIUM 40 MG PO TABS: 40.0000 mg | ORAL_TABLET | Freq: Every day | ORAL | 0 refills | Status: DC

## 2016-08-15 NOTE — Addendum Note (Signed)
Addended by: Rodolph BongOREY, Eshani Maestre S on: 08/15/2016 07:22 AM   Modules accepted: Orders

## 2016-09-03 ENCOUNTER — Ambulatory Visit (INDEPENDENT_AMBULATORY_CARE_PROVIDER_SITE_OTHER): Payer: BLUE CROSS/BLUE SHIELD | Admitting: Family Medicine

## 2016-09-03 VITALS — BP 115/65 | HR 88 | Wt 230.0 lb

## 2016-09-03 DIAGNOSIS — E291 Testicular hypofunction: Secondary | ICD-10-CM

## 2016-09-03 DIAGNOSIS — IMO0001 Reserved for inherently not codable concepts without codable children: Secondary | ICD-10-CM

## 2016-09-03 DIAGNOSIS — E1165 Type 2 diabetes mellitus with hyperglycemia: Secondary | ICD-10-CM

## 2016-09-03 DIAGNOSIS — E349 Endocrine disorder, unspecified: Secondary | ICD-10-CM

## 2016-09-03 DIAGNOSIS — E6609 Other obesity due to excess calories: Secondary | ICD-10-CM

## 2016-09-03 DIAGNOSIS — Z6834 Body mass index (BMI) 34.0-34.9, adult: Secondary | ICD-10-CM

## 2016-09-03 DIAGNOSIS — Z9189 Other specified personal risk factors, not elsewhere classified: Secondary | ICD-10-CM

## 2016-09-03 DIAGNOSIS — R7989 Other specified abnormal findings of blood chemistry: Secondary | ICD-10-CM

## 2016-09-03 DIAGNOSIS — D582 Other hemoglobinopathies: Secondary | ICD-10-CM | POA: Diagnosis not present

## 2016-09-03 DIAGNOSIS — E785 Hyperlipidemia, unspecified: Secondary | ICD-10-CM

## 2016-09-03 MED ORDER — TESTOSTERONE CYPIONATE 200 MG/ML IM SOLN
200.0000 mg | Freq: Once | INTRAMUSCULAR | Status: AC
  Administered 2016-09-03: 200 mg via INTRAMUSCULAR

## 2016-09-03 MED ORDER — AMBULATORY NON FORMULARY MEDICATION: 4 refills | Status: AC

## 2016-09-03 NOTE — Patient Instructions (Signed)
Thank you for coming in today. We will do a testosterone shot today.  Recheck in 3 months.  Keep a 1 month sugar log and mail or fax [phone]) it to me.  Restart Xigduo and continue Januvia.

## 2016-09-03 NOTE — Progress Notes (Signed)
Shane Williams is a 45 y.o. male who [name] today for a diabetes low testosterone high cholesterol and elevated hemoglobin.  Diabetes: Patient thought that he was supposed to stop Xigduo at the same time she started Januvia. He notes his blood sugars have been elevated with post meal sugars in the 200s. Fasting sugars have not gotten any lower than 90s. He denies polyuria or polydipsia feels pretty well.  Elevated cholesterol: Patient has started atorvastatin and feels well with no muscle pains or aches.  Elevated hemoglobin: Patient feels well with no chest pain palpitations shortness of breath or flushing.  Low testosterone: Patient continues to receive 200 mg of Depakote testosterone every 2 weeks. He has is feeling well with no changes or decreased  libido   Past Medical History:  Diagnosis Date  . Low testosterone 11/27/2012  . Type 2 diabetes mellitus (HCC) 11/27/2012   Past Surgical History:  Procedure Laterality Date  . APPENDECTOMY    . INCISE AND DRAIN ABCESS     4 times in last 2 years  . SHOULDER ARTHROSCOPY     Social History  Substance Use Topics  . Smoking status: Current Every Day Smoker    Packs/day: 0.25  . Smokeless tobacco: Not on file  . Alcohol use Yes   family history includes Diabetes in his father.  ROS as above:  Medications: Current Outpatient Prescriptions  Medication Sig Dispense Refill  . AMBULATORY NON FORMULARY MEDICATION One Touch Ultra Blue Test Strips.  Diagnosis: Diabetes Mellitus. Test 2 times a day 100 strip 4  . atorvastatin (LIPITOR) 40 MG tablet Take 1 tablet (40 mg total) by mouth daily. 90 tablet 0  . CIALIS 20 MG tablet TAKE 1 TABLET BY MOUTH DAILY AS NEEDED FOR ERECTILE DYSFUNCTION 10 tablet 0  . Lancets (ONETOUCH ULTRASOFT) lancets Use as directed 60 each 5  . sitaGLIPtin (JANUVIA) 100 MG tablet  Take 1 tablet (100 mg total) by mouth daily. 90 tablet 2  . testosterone cypionate (DEPOTESTOTERONE CYPIONATE) 100 MG/ML injection Inject 200 mg into the muscle every 14 (fourteen) days. For IM use only    . Dapagliflozin-Metformin HCl ER (XIGDUO XR) 02-999 MG TB24 Take 5-1,000 mg by mouth 2 (two) times daily. Evening dose can be cut to half a tablet if needed. (Patient not taking: Reported on 09/03/2016) 60 tablet 5   No current facility-administered medications for this visit.    Allergies  Allergen Reactions  . Penicillins Swelling  . Viagra [Sildenafil Citrate]     headache    Health Maintenance Health Maintenance  Topic Date Due  . OPHTHALMOLOGY EXAM  12/14/2015  . INFLUENZA VACCINE  06/21/2024 (Originally 05/21/2016)  . HEMOGLOBIN A1C  02/12/2017  . FOOT EXAM  08/06/2017  . URINE MICROALBUMIN  08/06/2017  . PNEUMOCOCCAL POLYSACCHARIDE VACCINE (2) 08/06/2021  . TETANUS/TDAP  08/06/2026  . HIV Screening  Completed     Exam:  BP 115/65   Pulse 88   Wt 230 lb (104.3 kg)   BMI 33.97 kg/m  Gen: Well NAD HEENT: EOMI,  MMM Lungs: Normal work of breathing. CTABL Heart: RRR no MRG Abd: NABS, Soft. Nondistended, Nontender Exts: Brisk capillary refill, warm and well perfused.   Lab Results  Component Value Date   WBC 7.6 08/14/2016   HGB 18.6 (H) 08/14/2016   HCT 55.1 (H) 08/14/2016   MCV 88.2 08/14/2016   PLT 205 08/14/2016   Lab Results  Component Value Date   HGBA1C 8.7 (H) 08/14/2016     Chemistry      Component Value Date/Time   NA 137 08/14/2016 0909   K 4.2 08/14/2016 0909   CL 101 08/14/2016 0909   CO2 24 08/14/2016 0909   BUN 11 08/14/2016 0909   CREATININE 1.02 08/14/2016 0909      Component Value Date/Time   CALCIUM 9.0 08/14/2016 0909   ALKPHOS 54 08/14/2016 0909   AST 32 08/14/2016 0909   ALT 45 08/14/2016 0909   BILITOT 0.7 08/14/2016 0909     Lab Results  Component Value Date   CHOL 170 08/14/2016   HDL 27 (L) 08/14/2016   LDLCALC 122  08/14/2016   TRIG 104 08/14/2016   CHOLHDL 6.3 (H) 08/14/2016      Assessment and Plan: 45 y.o. male with   Diabetes: Not super well-controlled. Continue Januvia and restart Xigduo. Recheck in 3 months. Patient will send me glucose logs next month.  Hyperlipidemia. Continue Lipitor recheck lipids and CMP in 3 months.  Elevated hemoglobin: Likely due to testosterone. Will reduce testosterone to 150 mg every 2 weeks and recheck hemoglobin and testosterone in about 3 months.    No orders of the defined types were placed in this encounter.   Discussed warning signs or symptoms. Please see discharge instructions. Patient expresses understanding.

## 2016-09-18 ENCOUNTER — Ambulatory Visit (INDEPENDENT_AMBULATORY_CARE_PROVIDER_SITE_OTHER): Payer: BLUE CROSS/BLUE SHIELD | Admitting: Family Medicine

## 2016-09-18 DIAGNOSIS — E291 Testicular hypofunction: Secondary | ICD-10-CM | POA: Diagnosis not present

## 2016-09-18 MED ORDER — TESTOSTERONE CYPIONATE 200 MG/ML IM SOLN
200.0000 mg | Freq: Once | INTRAMUSCULAR | Status: AC
  Administered 2016-09-18: 200 mg via INTRAMUSCULAR

## 2016-09-18 NOTE — Progress Notes (Signed)
Pt is here for a testosterone injection. Pt tolerated injection well in the LUOQ. Pt advised to make next appointment in 2 weeks. Pt is now getting 150 mL.

## 2016-10-02 ENCOUNTER — Ambulatory Visit (INDEPENDENT_AMBULATORY_CARE_PROVIDER_SITE_OTHER): Payer: BLUE CROSS/BLUE SHIELD | Admitting: Family Medicine

## 2016-10-02 VITALS — BP 132/80 | HR 92

## 2016-10-02 DIAGNOSIS — E291 Testicular hypofunction: Secondary | ICD-10-CM

## 2016-10-02 MED ORDER — TESTOSTERONE CYPIONATE 200 MG/ML IM SOLN
200.0000 mg | Freq: Once | INTRAMUSCULAR | Status: AC
  Administered 2016-10-02: 200 mg via INTRAMUSCULAR

## 2016-10-02 NOTE — Progress Notes (Signed)
Patient came into office today for testosterone injection. Denies chest pain, shortness of breath, headaches and problems associated with taking this medication. Patient states he has had no abnornal mood swings. Patient tolerated injection in RUOQ well without complications. Patient advised to schedule his next injection for 2 weeks from today. 

## 2016-10-16 ENCOUNTER — Ambulatory Visit: Payer: BLUE CROSS/BLUE SHIELD

## 2016-10-31 ENCOUNTER — Ambulatory Visit (INDEPENDENT_AMBULATORY_CARE_PROVIDER_SITE_OTHER): Payer: BLUE CROSS/BLUE SHIELD | Admitting: Family Medicine

## 2016-10-31 ENCOUNTER — Encounter: Payer: Self-pay | Admitting: Family Medicine

## 2016-10-31 VITALS — BP 126/71 | HR 85 | Wt 234.0 lb

## 2016-10-31 DIAGNOSIS — E349 Endocrine disorder, unspecified: Secondary | ICD-10-CM

## 2016-10-31 DIAGNOSIS — R7989 Other specified abnormal findings of blood chemistry: Secondary | ICD-10-CM

## 2016-10-31 DIAGNOSIS — E1165 Type 2 diabetes mellitus with hyperglycemia: Secondary | ICD-10-CM | POA: Diagnosis not present

## 2016-10-31 DIAGNOSIS — H00016 Hordeolum externum left eye, unspecified eyelid: Secondary | ICD-10-CM | POA: Diagnosis not present

## 2016-10-31 DIAGNOSIS — H00019 Hordeolum externum unspecified eye, unspecified eyelid: Secondary | ICD-10-CM | POA: Insufficient documentation

## 2016-10-31 DIAGNOSIS — E291 Testicular hypofunction: Secondary | ICD-10-CM | POA: Diagnosis not present

## 2016-10-31 DIAGNOSIS — D582 Other hemoglobinopathies: Secondary | ICD-10-CM | POA: Diagnosis not present

## 2016-10-31 DIAGNOSIS — IMO0001 Reserved for inherently not codable concepts without codable children: Secondary | ICD-10-CM

## 2016-10-31 DIAGNOSIS — E785 Hyperlipidemia, unspecified: Secondary | ICD-10-CM | POA: Diagnosis not present

## 2016-10-31 MED ORDER — POLYMYXIN B-TRIMETHOPRIM 10000-0.1 UNIT/ML-% OP SOLN: 2.0000 [drp] | OPHTHALMIC | 0 refills | Status: DC

## 2016-10-31 MED ORDER — TESTOSTERONE CYPIONATE 200 MG/ML IM SOLN
200.0000 mg | Freq: Once | INTRAMUSCULAR | Status: AC
  Administered 2016-10-31: 200 mg via INTRAMUSCULAR

## 2016-10-31 NOTE — Patient Instructions (Signed)
Thank you for coming in today. Get fasting labs in February.  Use warm compress as much as possible.  Use the eye drops.   Follow up with the eye doctor.  Recheck in 2 months or so.    Stye A stye is a bump on your eyelid caused by a bacterial infection. A stye can form inside the eyelid (internal stye) or outside the eyelid (external stye). An internal stye may be caused by an infected oil-producing gland inside your eyelid. An external stye may be caused by an infection at the base of your eyelash (hair follicle). Styes are very common. Anyone can get them at any age. They usually occur in just one eye, but you may have more than one in either eye. What are the causes? The infection is almost always caused by bacteria called Staphylococcus aureus. This is a common type of bacteria that lives on your skin. What increases the risk? You may be at higher risk for a stye if you have had one before. You may also be at higher risk if you have:  Diabetes.  Long-term illness.  Long-term eye redness.  A skin condition called seborrhea.  High fat levels in your blood (lipids). What are the signs or symptoms? Eyelid pain is the most common symptom of a stye. Internal styes are more painful than external styes. Other signs and symptoms may include:  Painful swelling of your eyelid.  A scratchy feeling in your eye.  Tearing and redness of your eye.  Pus draining from the stye. How is this diagnosed? Your health care provider may be able to diagnose a stye just by examining your eye. The health care provider may also check to make sure:  You do not have a fever or other signs of a more serious infection.  The infection has not spread to other parts of your eye or areas around your eye. How is this treated? Most styes will clear up in a few days without treatment. In some cases, you may need to use antibiotic drops or ointment to prevent infection. Your health care provider may have to  drain the stye surgically if your stye is:  Large.  Causing a lot of pain.  Interfering with your vision. This can be done using a thin blade or a needle. Follow these instructions at home:  Take medicines only as directed by your health care provider.  Apply a clean, warm compress to your eye for 10 minutes, 4 times a day.  Do not wear contact lenses or eye makeup until your stye has healed.  Do not try to pop or drain the stye. Contact a health care provider if:  You have chills or a fever.  Your stye does not go away after several days.  Your stye affects your vision.  Your eyeball becomes swollen, red, or painful. This information is not intended to replace advice given to you by your health care provider. Make sure you discuss any questions you have with your health care provider. Document Released: 07/17/2005 Document Revised: 06/02/2016 Document Reviewed: 01/21/2014 Elsevier Interactive Patient Education  2017 [employer]

## 2016-10-31 NOTE — Progress Notes (Signed)
Shane Williams is a 46 y.o. male who presents to [name]: [facility] today for left eye irritation, diabetes and testosterone.   1) left eye irritation:  Patient notes a 5 day history of swelling and redness at the left upper and lower eyelid. The solidified to a tender nodule. He denies significant vision changes fevers or chills nausea vomiting or diarrhea. He has tried warm compresses intermittently which has helped a little.  2) Diabetes: Patient takes the medications listed below. He feels well. He notes his blood sugars are typically pretty well controlled with fasting sugars in the low 100s. He denies polyuria or polydipsia.  3) testosterone: Patient continues to receive IM testosterone. He feels well and notes that it helps his libido.  Past Medical History:  Diagnosis Date  . Low testosterone 11/27/2012  . Type 2 diabetes mellitus (HCC) 11/27/2012   Past Surgical History:  Procedure Laterality Date  . APPENDECTOMY    . INCISE AND DRAIN ABCESS     4 times in last 2 years  . SHOULDER ARTHROSCOPY     Social History  Substance Use Topics  . Smoking status: Current Every Day Smoker    Packs/day: 0.25  . Smokeless tobacco: Not on file  . Alcohol use Yes   family history includes Diabetes in his father.  ROS as above:  Medications: Current Outpatient Prescriptions  Medication Sig Dispense Refill  . AMBULATORY NON FORMULARY MEDICATION One Touch Ultra Blue Test Strips.  Diagnosis: Diabetes Mellitus. Test 2 times a day 100 strip 4  . atorvastatin (LIPITOR) 40 MG tablet Take 1 tablet (40 mg total) by mouth daily. 90 tablet 0  . CIALIS 20 MG tablet TAKE 1 TABLET BY MOUTH DAILY AS NEEDED FOR ERECTILE DYSFUNCTION 10 tablet 0  . Dapagliflozin-Metformin HCl ER (XIGDUO XR) 02-999 MG TB24 Take 5-1,000 mg by mouth 2 (two) times daily. Evening dose can be cut to half a tablet if  needed. 60 tablet 5  . Lancets (ONETOUCH ULTRASOFT) lancets Use as directed 60 each 5  . sitaGLIPtin (JANUVIA) 100 MG tablet Take 1 tablet (100 mg total) by mouth daily. 90 tablet 2  . testosterone cypionate (DEPOTESTOTERONE CYPIONATE) 100 MG/ML injection Inject 200 mg into the muscle every 14 (fourteen) days. For IM use only     . trimethoprim-polymyxin b (POLYTRIM) ophthalmic solution Place 2 drops into the left eye every 4 (four) hours. 10 mL 0   No current facility-administered medications for this visit.    Allergies  Allergen Reactions  . Penicillins Swelling  . Viagra [Sildenafil Citrate]     headache    Health Maintenance Health Maintenance  Topic Date Due  . OPHTHALMOLOGY EXAM  12/14/2015  . INFLUENZA VACCINE  06/21/2024 (Originally 05/21/2016)  . HEMOGLOBIN A1C  02/12/2017  . FOOT EXAM  08/06/2017  . URINE MICROALBUMIN  08/06/2017  . PNEUMOCOCCAL POLYSACCHARIDE VACCINE (2) 08/06/2021  . TETANUS/TDAP  08/06/2026  . HIV Screening  Completed     Exam:  BP 126/71   Pulse 85   Wt 234 lb (106.1 kg)   BMI 34.56 kg/m  Gen: Well NAD HEENT: EOMI,  MMM Mildly erythematous tender nodule left upper lateral eyelid and middle left lower eyelid. No pus expressible. No conjunctival injection or discharge. PERRL. Lungs: Normal work of breathing. CTABL Heart: RRR no MRG Abd: NABS, Soft. Nondistended, Nontender Exts: Brisk capillary refill, warm and well perfused.    No results found for this  or any previous visit (from the past 72 hour(s)). No results found.    Assessment and Plan: 46 y.o. male with  Eye irritation: Hordeolum possibly developing into a chalazion. Plan to treat with warm compress, antibiotic eyedrops and referral to ophthalmology.  Diabetes: Much better. We'll check A1c after January 25 (3 month mark).  Check with fasting lipids and CMP.  Testosterone: We'll recheck testosterone levels and CBC at the next opportunity   Orders Placed This Encounter    Procedures  . CBC  . COMPLETE METABOLIC PANEL WITH GFR  . Lipid panel  . Hemoglobin A1c  . Testosterone  . Ambulatory referral to Ophthalmology    Referral Priority:   Routine    Referral Type:   Consultation    Referral Reason:   Specialty Services Required    Requested Specialty:   Ophthalmology    Number of Visits Requested:   1    Discussed warning signs or symptoms. Please see discharge instructions. Patient expresses understanding.

## 2016-11-14 ENCOUNTER — Ambulatory Visit: Payer: BLUE CROSS/BLUE SHIELD

## 2016-11-14 ENCOUNTER — Ambulatory Visit (INDEPENDENT_AMBULATORY_CARE_PROVIDER_SITE_OTHER): Payer: BLUE CROSS/BLUE SHIELD | Admitting: Sports Medicine

## 2016-11-14 VITALS — BP 138/77 | HR 97 | Temp 97.4°F | Wt 235.0 lb

## 2016-11-14 DIAGNOSIS — H00016 Hordeolum externum left eye, unspecified eyelid: Secondary | ICD-10-CM | POA: Diagnosis not present

## 2016-11-14 DIAGNOSIS — E291 Testicular hypofunction: Secondary | ICD-10-CM

## 2016-11-14 MED ORDER — HYDROCODONE-ACETAMINOPHEN 5-325 MG PO TABS: 1.0000 | ORAL_TABLET | Freq: Three times a day (TID) | ORAL | 0 refills | Status: DC | PRN

## 2016-11-14 MED ORDER — TESTOSTERONE CYPIONATE 200 MG/ML IM SOLN
200.0000 mg | Freq: Once | INTRAMUSCULAR | Status: AC
  Administered 2016-11-14: 200 mg via INTRAMUSCULAR

## 2016-11-14 MED ORDER — DOXYCYCLINE HYCLATE 100 MG PO TABS: 100.0000 mg | ORAL_TABLET | Freq: Two times a day (BID) | ORAL | 0 refills | Status: AC

## 2016-11-14 NOTE — Progress Notes (Signed)
Patient came into office today for testosterone injection. Denies chest pain, shortness of breath, headaches and problems associated with taking this medication. Patient states he has had no abnornal mood swings. Patient tolerated injection in ROUQ well without complications. Patient advised to schedule his next injection for 2 weeks from today. 

## 2016-11-14 NOTE — Patient Instructions (Signed)
Stye A stye is a bump on your eyelid caused by a bacterial infection. A stye can form inside the eyelid (internal stye) or outside the eyelid (external stye). An internal stye may be caused by an infected oil-producing gland inside your eyelid. An external stye may be caused by an infection at the base of your eyelash (hair follicle). Styes are very common. Anyone can get them at any age. They usually occur in just one eye, but you may have more than one in either eye. What are the causes? The infection is almost always caused by bacteria called Staphylococcus aureus. This is a common type of bacteria that lives on your skin. What increases the risk? You may be at higher risk for a stye if you have had one before. You may also be at higher risk if you have:  Diabetes.  Long-term illness.  Long-term eye redness.  A skin condition called seborrhea.  High fat levels in your blood (lipids). What are the signs or symptoms? Eyelid pain is the most common symptom of a stye. Internal styes are more painful than external styes. Other signs and symptoms may include:  Painful swelling of your eyelid.  A scratchy feeling in your eye.  Tearing and redness of your eye.  Pus draining from the stye. How is this diagnosed? Your health care provider may be able to diagnose a stye just by examining your eye. The health care provider may also check to make sure:  You do not have a fever or other signs of a more serious infection.  The infection has not spread to other parts of your eye or areas around your eye. How is this treated? Most styes will clear up in a few days without treatment. In some cases, you may need to use antibiotic drops or ointment to prevent infection. Your health care provider may have to drain the stye surgically if your stye is:  Large.  Causing a lot of pain.  Interfering with your vision. This can be done using a thin blade or a needle. Follow these instructions at  home:  Take medicines only as directed by your health care provider.  Apply a clean, warm compress to your eye for 10 minutes, 4 times a day.  Do not wear contact lenses or eye makeup until your stye has healed.  Do not try to pop or drain the stye. Contact a health care provider if:  You have chills or a fever.  Your stye does not go away after several days.  Your stye affects your vision.  Your eyeball becomes swollen, red, or painful. This information is not intended to replace advice given to you by your health care provider. Make sure you discuss any questions you have with your health care provider. Document Released: 07/17/2005 Document Revised: 06/02/2016 Document Reviewed: 01/21/2014 Elsevier Interactive Patient Education  2017 [employer]vier Inc.  

## 2016-11-14 NOTE — Assessment & Plan Note (Signed)
Reason for a couple of weeks now, worsening, incision and drainage as above, doxycycline. Return to see me early next week.

## 2016-11-14 NOTE — Progress Notes (Signed)
  Subjective:    CC: Stye  HPI: This is a pleasant 46 year old male with a stye, this is been present for 2 weeks now, topical antibiotic drops were not effective, and he never followed up with ophthalmology. He is here with resolution of the stye on the upper eyelid but persistence and worsening of the one on the lower eyelid. Symptoms are moderate, persistent, no visual changes but he does have moderate pain at the location of swelling.  Past medical history:  Negative.  See flowsheet/record as well for more information.  Surgical history: Negative.  See flowsheet/record as well for more information.  Family history: Negative.  See flowsheet/record as well for more information.  Social history: Negative.  See flowsheet/record as well for more information.  Allergies, and medications have been entered into the medical record, reviewed, and no changes needed.   Review of Systems: No fevers, chills, night sweats, weight loss, chest pain, or shortness of breath.   Objective:    General: Well Developed, well nourished, and in no acute distress.  Neuro: Alert and oriented x3, extra-ocular muscles intact, sensation grossly intact.  HEENT: Normocephalic, atraumatic, pupils equal round reactive to light, neck supple, no masses, no lymphadenopathy, thyroid nonpalpable. Oropharynx, nasopharynx, ear canals unremarkable, there is a large stye partially draining of the lower eyelid. It is erythematous, no proptosis, no anterior chamber chemosis, no hyphema. Skin: Warm and dry, no rashes. Cardiac: Regular rate and rhythm, no murmurs rubs or gallops, no lower extremity edema.  Respiratory: Clear to auscultation bilaterally. Not using accessory muscles, speaking in full sentences.  Complicated Incision and drainage of lower eyelid abscess. Risks, benefits, and alternatives explained and consent obtained. Time out conducted. Surface cleaned with alcohol. 1cc lidocaine without epinephine infiltrated  around abscess. Adequate anesthesia ensured. Area prepped and draped in a sterile fashion. #11 blade used to make a shallow stab incision into abscess. Pus expressed with pressure. Pt stable. Aftercare and follow-up advised.  Impression and Recommendations:    Hordeolum externum Reason for a couple of weeks now, worsening, incision and drainage as above, doxycycline. Return to see me early next week.  I spent 25 minutes with this patient, greater than 50% was face-to-face time counseling regarding the above diagnoses, this was separate from the time spent performing the above procedure

## 2016-11-17 LAB — WOUND CULTURE
Gram Stain: NONE SEEN
Gram Stain: NONE SEEN
Organism ID, Bacteria: NORMAL

## 2016-11-18 ENCOUNTER — Ambulatory Visit (INDEPENDENT_AMBULATORY_CARE_PROVIDER_SITE_OTHER): Payer: BLUE CROSS/BLUE SHIELD | Admitting: Sports Medicine

## 2016-11-18 ENCOUNTER — Encounter: Payer: Self-pay | Admitting: Sports Medicine

## 2016-11-18 DIAGNOSIS — H00016 Hordeolum externum left eye, unspecified eyelid: Secondary | ICD-10-CM | POA: Diagnosis not present

## 2016-11-18 NOTE — Assessment & Plan Note (Signed)
Doing well after incision and drainage, patient will continue antibiotics, massage and warm compresses. Return to see me in a week and a half.

## 2016-11-18 NOTE — Progress Notes (Signed)
  Subjective:    CC: Follow-up  HPI: This is a pleasant 46 year old male, we did an incision and drainage on a lower eyelid hordeolum, he returns today feeling significantly better, currently on antibiotics.  Past medical history:  Negative.  See flowsheet/record as well for more information.  Surgical history: Negative.  See flowsheet/record as well for more information.  Family history: Negative.  See flowsheet/record as well for more information.  Social history: Negative.  See flowsheet/record as well for more information.  Allergies, and medications have been entered into the medical record, reviewed, and no changes needed.   Review of Systems: No fevers, chills, night sweats, weight loss, chest pain, or shortness of breath.   Objective:    General: Well Developed, well nourished, and in no acute distress.  Neuro: Alert and oriented x3, extra-ocular muscles intact, sensation grossly intact.  HEENT: Normocephalic, atraumatic, pupils equal round reactive to light, neck supple, no masses, no lymphadenopathy, thyroid nonpalpable. Left lower eyelid still has only minimal erythema, there is mild fullness and fluctuance over the abscess, no drainage. Vision is grossly normal. There is no evidence of extension to the inside of the eyelid. No anterior chamber chemosis, no proptosis, no hyphema. Skin: Warm and dry, no rashes. Cardiac: Regular rate and rhythm, no murmurs rubs or gallops, no lower extremity edema.  Respiratory: Clear to auscultation bilaterally. Not using accessory muscles, speaking in full sentences.  Impression and Recommendations:    Hordeolum externum Doing well after incision and drainage, patient will continue antibiotics, massage and warm compresses. Return to see me in a week and a half.

## 2016-11-19 ENCOUNTER — Other Ambulatory Visit: Payer: Self-pay | Admitting: Family Medicine

## 2016-11-28 ENCOUNTER — Ambulatory Visit: Payer: BLUE CROSS/BLUE SHIELD

## 2016-11-28 ENCOUNTER — Encounter: Payer: Self-pay | Admitting: Sports Medicine

## 2016-11-28 ENCOUNTER — Ambulatory Visit (INDEPENDENT_AMBULATORY_CARE_PROVIDER_SITE_OTHER): Payer: BLUE CROSS/BLUE SHIELD | Admitting: Sports Medicine

## 2016-11-28 VITALS — BP 105/65 | HR 101 | Resp 18 | Wt 235.4 lb

## 2016-11-28 DIAGNOSIS — E291 Testicular hypofunction: Secondary | ICD-10-CM

## 2016-11-28 DIAGNOSIS — H00016 Hordeolum externum left eye, unspecified eyelid: Secondary | ICD-10-CM

## 2016-11-28 MED ORDER — CLINDAMYCIN HCL 300 MG PO CAPS: 300.0000 mg | ORAL_CAPSULE | Freq: Three times a day (TID) | ORAL | 0 refills | Status: DC

## 2016-11-28 MED ORDER — TESTOSTERONE CYPIONATE 200 MG/ML IM SOLN
200.0000 mg | Freq: Once | INTRAMUSCULAR | Status: AC
Start: 2016-11-28 — End: 2016-11-28
  Administered 2016-11-28: 200 mg via INTRAMUSCULAR

## 2016-11-28 NOTE — Progress Notes (Signed)
  Subjective:    CC: follow-up  HPI: It's been 3 weeks now, this pleasant 46 year old male has had a stye on his left lower eyelid.  Unfortunately he did not respond to doxycycline, a small I&D, and Septra. Things have improved however he still has significant swelling and pain. Symptoms are moderate, persistent. No visual changes, he did note a new stye on his left upper eyelid.  Past medical history:  Negative.  See flowsheet/record as well for more information.  Surgical history: Negative.  See flowsheet/record as well for more information.  Family history: Negative.  See flowsheet/record as well for more information.  Social history: Negative.  See flowsheet/record as well for more information.  Allergies, and medications have been entered into the medical record, reviewed, and no changes needed.   Review of Systems: No fevers, chills, night sweats, weight loss, chest pain, or shortness of breath.   Objective:    General: Well Developed, well nourished, and in no acute distress.  Neuro: Alert and oriented x3, extra-ocular muscles intact, sensation grossly intact.  HEENT: Normocephalic, atraumatic, pupils equal round reactive to light, neck supple, no masses, no lymphadenopathy, thyroid nonpalpable. 1 cm swollen stye on his lower eyelid on the left.Small amount of erythema on the left upper eyelid. I'm still able to express some lens from the swollen stye on his lower eyelid. No anterior chamber chemosis, hyphema, no proptosis. Skin: Warm and dry, no rashes. Cardiac: Regular rate and rhythm, no murmurs rubs or gallops, no lower extremity edema.  Respiratory: Clear to auscultation bilaterally. Not using accessory muscles, speaking in full sentences.  Impression and Recommendations:    Hordeolum externum 3 weeks now with persistent symptoms. Has been through a course of doxycycline, Septra, I did perform a small I&D initially, switching to clindamycin. Continue warm compresses and I do  want to refer him to ophthalmology.  I spent 25 minutes with this patient, greater than 50% was face-to-face time counseling regarding the above diagnoses

## 2016-11-28 NOTE — Assessment & Plan Note (Signed)
3 weeks now with persistent symptoms. Has been through a course of doxycycline, Septra, I did perform a small I&D initially, switching to clindamycin. Continue warm compresses and I do want to refer him to ophthalmology.

## 2016-12-02 DIAGNOSIS — H0015 Chalazion left lower eyelid: Secondary | ICD-10-CM | POA: Diagnosis not present

## 2016-12-03 ENCOUNTER — Ambulatory Visit: Payer: BLUE CROSS/BLUE SHIELD | Admitting: Family Medicine

## 2016-12-16 ENCOUNTER — Ambulatory Visit (INDEPENDENT_AMBULATORY_CARE_PROVIDER_SITE_OTHER): Payer: BLUE CROSS/BLUE SHIELD | Admitting: Family Medicine

## 2016-12-16 VITALS — BP 104/67 | HR 96 | Temp 98.0°F | Resp 16 | Ht 70.0 in | Wt 232.0 lb

## 2016-12-16 DIAGNOSIS — E291 Testicular hypofunction: Secondary | ICD-10-CM

## 2016-12-16 MED ORDER — TESTOSTERONE CYPIONATE 200 MG/ML IM SOLN
200.0000 mg | INTRAMUSCULAR | Status: DC
  Administered 2016-12-16: 200 mg via INTRAMUSCULAR

## 2016-12-16 NOTE — Progress Notes (Signed)
   Subjective:    Patient ID: Shane Williams, male    DOB: 1971/01/01, 46 y.o.   MRN: [id number]  HPI    Review of Systems     Objective:   Physical Exam        Assessment & Plan:  Patient tolerated injection well without complications. Patient advised to schedule next injection in 14 days.

## 2016-12-16 NOTE — Progress Notes (Signed)
Patient here for a testosterone injection. Denies chest pain, shortness of breath, headaches and problems with medication or mood changes. He reports appt. Scheduled for eye exam 02/10/17.

## 2016-12-22 ENCOUNTER — Other Ambulatory Visit: Payer: Self-pay | Admitting: Family Medicine

## 2016-12-30 ENCOUNTER — Ambulatory Visit (INDEPENDENT_AMBULATORY_CARE_PROVIDER_SITE_OTHER): Payer: BLUE CROSS/BLUE SHIELD | Admitting: Family Medicine

## 2016-12-30 VITALS — BP 122/70 | HR 104

## 2016-12-30 DIAGNOSIS — E291 Testicular hypofunction: Secondary | ICD-10-CM

## 2016-12-30 MED ORDER — TESTOSTERONE CYPIONATE 200 MG/ML IM SOLN
200.0000 mg | Freq: Once | INTRAMUSCULAR | Status: AC
  Administered 2016-12-30: 200 mg via INTRAMUSCULAR

## 2016-12-30 NOTE — Progress Notes (Signed)
Patient came into office today for testosterone injection. Denies chest pain, shortness of breath, headaches and problems associated with taking this medication. Patient states he has had no abnornal mood swings. Patient tolerated injection in LUOQ well without complications. Patient advised to schedule his next injection for 2 weeks from today.  Patient is scheduled to get his eye exam in April.

## 2017-01-06 ENCOUNTER — Other Ambulatory Visit: Payer: Self-pay | Admitting: Family Medicine

## 2017-01-14 ENCOUNTER — Ambulatory Visit: Payer: BLUE CROSS/BLUE SHIELD

## 2017-01-15 DIAGNOSIS — D582 Other hemoglobinopathies: Secondary | ICD-10-CM | POA: Diagnosis not present

## 2017-01-15 DIAGNOSIS — E1165 Type 2 diabetes mellitus with hyperglycemia: Secondary | ICD-10-CM | POA: Diagnosis not present

## 2017-01-15 DIAGNOSIS — E349 Endocrine disorder, unspecified: Secondary | ICD-10-CM | POA: Diagnosis not present

## 2017-01-15 DIAGNOSIS — E291 Testicular hypofunction: Secondary | ICD-10-CM | POA: Diagnosis not present

## 2017-01-15 LAB — COMPLETE METABOLIC PANEL WITH GFR
ALT: 38 U/L (ref 9–46)
AST: 23 U/L (ref 10–40)
Albumin: 3.9 g/dL (ref 3.6–5.1)
Alkaline Phosphatase: 68 U/L (ref 40–115)
BILIRUBIN TOTAL: 0.9 mg/dL (ref 0.2–1.2)
BUN: 14 mg/dL (ref 7–25)
CO2: 25 mmol/L (ref 20–31)
CREATININE: 1.03 mg/dL (ref 0.60–1.35)
Calcium: 9.2 mg/dL (ref 8.6–10.3)
Chloride: 101 mmol/L (ref 98–110)
GFR, Est Non African American: 86 mL/min (ref >=60)
Glucose, Bld: 214 mg/dL — ABNORMAL HIGH (ref 65–99)
Potassium: 4.1 mmol/L (ref 3.5–5.3)
Sodium: 136 mmol/L (ref 135–146)
TOTAL PROTEIN: 6.9 g/dL (ref 6.1–8.1)

## 2017-01-15 LAB — LIPID PANEL
CHOLESTEROL: 99 mg/dL (ref <200)
HDL: 23 mg/dL — ABNORMAL LOW (ref >40)
LDL Cholesterol: 47 mg/dL (ref <100)
TRIGLYCERIDES: 144 mg/dL (ref <150)
Total CHOL/HDL Ratio: 4.3 Ratio (ref <5.0)
VLDL: 29 mg/dL (ref <30)

## 2017-01-15 LAB — CBC
HCT: 54.7 % — ABNORMAL HIGH (ref 38.5–50.0)
Hemoglobin: 18.5 g/dL — ABNORMAL HIGH (ref 13.2–17.1)
MCH: 29.5 pg (ref 27.0–33.0)
MCHC: 33.8 g/dL (ref 32.0–36.0)
MCV: 87.2 fL (ref 80.0–100.0)
MPV: 9.5 fL (ref 7.5–12.5)
PLATELETS: 195 10*3/uL (ref 140–400)
RBC: 6.27 MIL/uL — AB (ref 4.20–5.80)
RDW: 13.6 % (ref 11.0–15.0)
WBC: 8.8 10*3/uL (ref 3.8–10.8)

## 2017-01-16 LAB — TESTOSTERONE: TESTOSTERONE: 257 ng/dL (ref 250–827)

## 2017-01-16 LAB — HEMOGLOBIN A1C
Hgb A1c MFr Bld: 7.9 % — ABNORMAL HIGH (ref <5.7)
Mean Plasma Glucose: 180 mg/dL

## 2017-01-21 ENCOUNTER — Ambulatory Visit (INDEPENDENT_AMBULATORY_CARE_PROVIDER_SITE_OTHER): Payer: BLUE CROSS/BLUE SHIELD | Admitting: Family Medicine

## 2017-01-21 VITALS — BP 124/71 | HR 95 | Ht 70.0 in | Wt 232.0 lb

## 2017-01-21 DIAGNOSIS — E349 Endocrine disorder, unspecified: Secondary | ICD-10-CM | POA: Diagnosis not present

## 2017-01-21 DIAGNOSIS — R7989 Other specified abnormal findings of blood chemistry: Secondary | ICD-10-CM

## 2017-01-21 MED ORDER — TESTOSTERONE CYPIONATE 200 MG/ML IM SOLN
200.0000 mg | Freq: Once | INTRAMUSCULAR | Status: AC
  Administered 2017-01-21: 200 mg via INTRAMUSCULAR

## 2017-01-21 NOTE — Progress Notes (Signed)
   Subjective:    Patient ID: Shane Williams, male    DOB: 02-12-1971, 46 y.o.   MRN: [id number]  HPI Pt is here for a Testosterone Injection today. Pt denies mood swings, palpitations, chest pain, or any medication problems.     Review of Systems     Objective:   Physical Exam        Assessment & Plan:  Pt tolerated injection in RUOQ well and without complications. Pt advised to schedule a Follow-up visit in 1 week with provider.

## 2017-01-29 ENCOUNTER — Ambulatory Visit (INDEPENDENT_AMBULATORY_CARE_PROVIDER_SITE_OTHER): Payer: BLUE CROSS/BLUE SHIELD | Admitting: Family Medicine

## 2017-01-29 ENCOUNTER — Encounter: Payer: Self-pay | Admitting: Family Medicine

## 2017-01-29 VITALS — BP 133/75 | HR 68 | Wt 232.0 lb

## 2017-01-29 DIAGNOSIS — E349 Endocrine disorder, unspecified: Secondary | ICD-10-CM

## 2017-01-29 DIAGNOSIS — R7989 Other specified abnormal findings of blood chemistry: Secondary | ICD-10-CM

## 2017-01-29 DIAGNOSIS — D582 Other hemoglobinopathies: Secondary | ICD-10-CM | POA: Diagnosis not present

## 2017-01-29 DIAGNOSIS — E1165 Type 2 diabetes mellitus with hyperglycemia: Secondary | ICD-10-CM

## 2017-01-29 DIAGNOSIS — IMO0001 Reserved for inherently not codable concepts without codable children: Secondary | ICD-10-CM

## 2017-01-29 LAB — IRON AND TIBC
%SAT: 19 % (ref 15–60)
Iron: 58 ug/dL (ref 50–180)
TIBC: 301 ug/dL (ref 250–425)
UIBC: 243 ug/dL (ref 125–400)

## 2017-01-29 MED ORDER — ATORVASTATIN CALCIUM 40 MG PO TABS
40.0000 mg | ORAL_TABLET | Freq: Every day | ORAL | 2 refills | Status: DC
Start: 2017-01-29 — End: 2017-10-08

## 2017-01-29 NOTE — Progress Notes (Signed)
Shane Williams is a 46 y.o. male who presents to [facility] Shane Williams: Primary Care Sports Medicine today for follow up diabetes,  Low testosterone and elevated hemoglobin.   Diabetes: Patient takes medicine listed below and feels well. He notes his blood sugars are typically in the 120s range and denies any polyuria or polydipsia.  Low testosterone: Patient is currently receiving 100 mg of depo testosterone every 2 weeks. This was decreased recently due to elevated hemoglobin. He thinks it's less effective than the 200 mg every 2 week dose.  Elevated hemoglobin: Patient was found to have hemoglobin elevated above 18. His testosterone is reduced as a result. He denies any personal or family history of hemochromatosis.   Past Medical History:  Diagnosis Date  . Low testosterone 11/27/2012  . Type 2 diabetes mellitus (HCC) 11/27/2012   Past Surgical History:  Procedure Laterality Date  . APPENDECTOMY    . INCISE AND DRAIN ABCESS     4 times in last 2 years  . SHOULDER ARTHROSCOPY     Social History  Substance Use Topics  . Smoking status: Current Every Day Smoker    Packs/day: 0.25  . Smokeless tobacco: Never Used  . Alcohol use Yes   family history includes Diabetes in his father.  ROS as above:  Medications: Current Outpatient Prescriptions  Medication Sig Dispense Refill  . AMBULATORY NON FORMULARY MEDICATION One Touch Ultra Blue Test Strips.  Diagnosis: Diabetes Mellitus. Test 2 times a day 100 strip 4  . atorvastatin (LIPITOR) 40 MG tablet Take 1 tablet (40 mg total) by mouth daily. 90 tablet 2  . CIALIS 20 MG tablet TAKE 1 TABLET BY MOUTH DAILY AS NEEDED FOR ERECTILE DYSFUNCTION 10 tablet 0  . Dapagliflozin-Metformin HCl ER (XIGDUO XR) 02-999 MG TB24 Take 5-1,000 mg by mouth 2 (two) times daily. Evening dose can be cut to half a tablet if needed. 60 tablet 5  . Lancets (ONETOUCH ULTRASOFT)  lancets Use as directed 60 each 5  . sitaGLIPtin (JANUVIA) 100 MG tablet Take 1 tablet (100 mg total) by mouth daily. 90 tablet 2  . testosterone cypionate (DEPOTESTOTERONE CYPIONATE) 100 MG/ML injection Inject 200 mg into the muscle every 14 (fourteen) days. For IM use only      No current facility-administered medications for this visit.    Allergies  Allergen Reactions  . Penicillins Swelling  . Viagra [Sildenafil Citrate]     headache    Health Maintenance Health Maintenance  Topic Date Due  . OPHTHALMOLOGY EXAM  02/27/2018 (Originally 12/14/2015)  . INFLUENZA VACCINE  06/21/2024 (Originally 05/21/2017)  . HEMOGLOBIN A1C  07/18/2017  . FOOT EXAM  08/06/2017  . URINE MICROALBUMIN  08/06/2017  . PNEUMOCOCCAL POLYSACCHARIDE VACCINE (2) 08/06/2021  . TETANUS/TDAP  08/06/2026  . HIV Screening  Completed     Exam:  BP 133/75   Pulse 68   Wt 232 lb (105.2 kg)   BMI 33.29 kg/m  Gen: Well NAD HEENT: EOMI,  MMM Lungs: Normal work of breathing. CTABL Heart: RRR no MRG Abd: NABS, Soft. Nondistended, Nontender Exts: Brisk capillary refill, warm and well perfused.   Lab Results  Component Value Date   TESTOSTERONE 257 01/15/2017   Lab Results  Component Value Date   WBC 8.8 01/15/2017   HGB 18.5 (H) 01/15/2017   HCT 54.7 (H) 01/15/2017   MCV 87.2 01/15/2017   PLT 195 01/15/2017    Lab Results  Component Value Date   IRON  47 (L) 08/14/2016   TIBC 353 08/14/2016   FERRITIN 124 08/14/2016     No results found for this or any previous visit (from the past 72 hour(s)). No results found.    Assessment and Plan: 46 y.o. male with elevated hemoglobin likely results results of smoking and testosterone. Testosterone was decreased. We'll check iron stores and will check for hemachromatosis with DNA test. Recheck in about 3 months. Recommend phlebotomy in smoking cessation.  Low testosterone: Continue 100 mg twice daily. We'll increase testosterone hemoglobin is  reduced.  Diabetes doing well continue current regimen.   Orders Placed This Encounter  Procedures  . Hemochromatosis DNA-PCR(c282y,h63d)  . Ferritin  . Iron and TIBC   Meds ordered this encounter  Medications  . atorvastatin (LIPITOR) 40 MG tablet    Sig: Take 1 tablet (40 mg total) by mouth daily.    Dispense:  90 tablet    Refill:  2     Discussed warning signs or symptoms. Please see discharge instructions. Patient expresses understanding.

## 2017-01-29 NOTE — Patient Instructions (Signed)
Thank you for coming in today. Get labs to recheck iron stores.  I recommend donating blood regularly at the [employer].  I recommend reducing smoking to 5 cigarettes a day.  Continue testosterone injections 100 mg every 2 weeks.  We can increase this when hemoglobin levels go down.  We will recheck hemoglobin in about 3 months.  Hemochromatosis Hemochromatosis, also called iron storage disease, is a condition in which the body stores too much iron. The excess iron builds up in your joints, heart, liver, pancreas, and other organs and damages them. What are the causes? Hemochromatosis may be caused by:  Abnormal genes. These are passed down (inherited) from both of your parents.  Having blood transfusions.  Having too much iron in your diet. What increases the risk?  Being Caucasian.  Inheriting abnormal genes from both your parents.  Having a severe or long-term loss of red blood cells (anemia). What are the signs or symptoms? Signs and symptoms can start at any age, but usually start in middle age. They may include:  Fatigue.  Weakness.  Joint pain.  Abdominal pain.  Weight loss.  Wallace Cullens or bronze skin coloring.  Loss of interest in sex.  Loss of menstrual periods in women.  Loss of body hair.  Shortness of breath. Late signs of hemochromatosis include damage to the liver, heart, or pancreas. This may lead to:  Liver cancer.  Abnormal heart rhythms.  Heart failure.  Diabetes. How is this diagnosed? Your health care provider will perform a physical exam and ask about your symptoms and family history. Tests may be done to confirm the diagnosis. Blood tests may include:  Transferrin saturation. This test measures how much iron is bound to hemoglobin in your blood. Hemoglobin is a substance in red blood cells that carries oxygen to the tissues of the body.  Serum ferritin. This test measures a protein that stores iron in your blood.  A test to check for the  abnormal genes that cause the condition. You may have a tissue sample taken from your liver with a needle (biopsy) for testing. The results will show if iron is building up in your liver. How is this treated? Hemochromatosis is treated by removing iron by taking blood (phlebotomy). Having a phlebotomy is similar to having blood taken for donation. The procedure is simple and effective as long as it is started before organ damage develops. When you begin treatment, you may have a pint of blood removed once or twice a week. You may have blood tests done to determine when your iron levels return to normal. Once your iron levels are normal, you may only need to have a phlebotomy every few months. Follow these instructions at home:  Do not eat a lot of foods that are high in iron. Iron-rich foods include red meats and organ meats.  Do not take dietary supplements that contain iron.  Do not take vitamin C supplements. Vitamin C increases iron absorption.  Do not eat raw shellfish or raw fish. Hemochromatosis may increase your chance of infection from these foods.  If you have liver damage, do not drink any alcohol.  If you do not have liver damage, limit alcohol intake to no more than 1 drink per day for nonpregnant women and 2 drinks per day for men. One drink equals 12 ounces of beer, 5 ounces of wine, or 1 ounces of hard liquor.  Try to exercise for at least 30 minutes on most days of the week.  Keep all follow-up visits as directed by your health care provider. This is important. Contact a health care provider if:  You have fatigue.  You have weakness.  You have joint pain.  You have abdominal pain.  You experience weight loss.  You have shortness of breath. Get help right away if:  You have chest pain.  You have trouble breathing. This information is not intended to replace advice given to you by your health care provider. Make sure you discuss any questions you have with your  health care provider. Document Released: 10/04/2000 Document Revised: 03/14/2016 Document Reviewed: 12/01/2013 Elsevier Interactive Patient Education  2017 [employer].

## 2017-01-30 LAB — FERRITIN: Ferritin: 135 ng/mL (ref 20–380)

## 2017-02-03 LAB — HEMOCHROMATOSIS DNA-PCR(C282Y,H63D)

## 2017-02-04 ENCOUNTER — Ambulatory Visit (INDEPENDENT_AMBULATORY_CARE_PROVIDER_SITE_OTHER): Payer: BLUE CROSS/BLUE SHIELD | Admitting: Family Medicine

## 2017-02-04 VITALS — BP 123/72 | HR 88

## 2017-02-04 DIAGNOSIS — E291 Testicular hypofunction: Secondary | ICD-10-CM

## 2017-02-04 MED ORDER — TESTOSTERONE CYPIONATE 200 MG/ML IM SOLN
200.0000 mg | Freq: Once | INTRAMUSCULAR | Status: AC
  Administered 2017-02-04: 200 mg via INTRAMUSCULAR

## 2017-02-04 NOTE — Progress Notes (Signed)
Patient came into office today for testosterone injection. Denies chest pain, shortness of breath, headaches and problems associated with taking this medication. Patient states he has had no abnornal mood swings. Patient tolerated injection in LUOQ well without complications. Patient advised to schedule his next injection for 2 weeks from today. 

## 2017-02-10 DIAGNOSIS — H40003 Preglaucoma, unspecified, bilateral: Secondary | ICD-10-CM | POA: Diagnosis not present

## 2017-02-10 DIAGNOSIS — E119 Type 2 diabetes mellitus without complications: Secondary | ICD-10-CM | POA: Diagnosis not present

## 2017-02-10 DIAGNOSIS — H527 Unspecified disorder of refraction: Secondary | ICD-10-CM | POA: Diagnosis not present

## 2017-02-10 DIAGNOSIS — H01006 Unspecified blepharitis left eye, unspecified eyelid: Secondary | ICD-10-CM | POA: Diagnosis not present

## 2017-02-10 DIAGNOSIS — H0015 Chalazion left lower eyelid: Secondary | ICD-10-CM | POA: Diagnosis not present

## 2017-02-10 LAB — HM DIABETES EYE EXAM

## 2017-02-14 DIAGNOSIS — R238 Other skin changes: Secondary | ICD-10-CM | POA: Diagnosis not present

## 2017-02-14 DIAGNOSIS — H0015 Chalazion left lower eyelid: Secondary | ICD-10-CM | POA: Diagnosis not present

## 2017-02-14 DIAGNOSIS — H00015 Hordeolum externum left lower eyelid: Secondary | ICD-10-CM | POA: Diagnosis not present

## 2017-02-14 DIAGNOSIS — L080 Pyoderma: Secondary | ICD-10-CM | POA: Diagnosis not present

## 2017-02-17 DIAGNOSIS — H00019 Hordeolum externum unspecified eye, unspecified eyelid: Secondary | ICD-10-CM | POA: Diagnosis not present

## 2017-02-17 DIAGNOSIS — H01006 Unspecified blepharitis left eye, unspecified eyelid: Secondary | ICD-10-CM | POA: Diagnosis not present

## 2017-02-17 DIAGNOSIS — H0015 Chalazion left lower eyelid: Secondary | ICD-10-CM | POA: Diagnosis not present

## 2017-02-17 DIAGNOSIS — H40003 Preglaucoma, unspecified, bilateral: Secondary | ICD-10-CM | POA: Diagnosis not present

## 2017-02-20 ENCOUNTER — Ambulatory Visit (INDEPENDENT_AMBULATORY_CARE_PROVIDER_SITE_OTHER): Payer: BLUE CROSS/BLUE SHIELD | Admitting: Family Medicine

## 2017-02-20 VITALS — BP 121/71 | HR 82

## 2017-02-20 DIAGNOSIS — E291 Testicular hypofunction: Secondary | ICD-10-CM

## 2017-02-20 MED ORDER — TESTOSTERONE CYPIONATE 200 MG/ML IM SOLN
200.0000 mg | Freq: Once | INTRAMUSCULAR | Status: AC
  Administered 2017-02-20: 200 mg via INTRAMUSCULAR

## 2017-02-20 NOTE — Progress Notes (Signed)
Patient came into office today for testosterone injection. Denies chest pain, shortness of breath, headaches and problems associated with taking this medication. Patient states he has had no abnornal mood swings. Patient tolerated injection in ROUQ well without complications. Patient advised to schedule his next injection for 2 weeks from today. Pt did get his DM eye exam completed, have requested records.

## 2017-02-24 DIAGNOSIS — H00019 Hordeolum externum unspecified eye, unspecified eyelid: Secondary | ICD-10-CM | POA: Diagnosis not present

## 2017-02-24 DIAGNOSIS — H40003 Preglaucoma, unspecified, bilateral: Secondary | ICD-10-CM | POA: Diagnosis not present

## 2017-02-24 DIAGNOSIS — H0015 Chalazion left lower eyelid: Secondary | ICD-10-CM | POA: Diagnosis not present

## 2017-02-24 DIAGNOSIS — H01006 Unspecified blepharitis left eye, unspecified eyelid: Secondary | ICD-10-CM | POA: Diagnosis not present

## 2017-02-27 ENCOUNTER — Other Ambulatory Visit: Payer: Self-pay | Admitting: *Deleted

## 2017-02-27 MED ORDER — TADALAFIL 20 MG PO TABS: ORAL_TABLET | ORAL | 0 refills | Status: DC

## 2017-03-04 ENCOUNTER — Other Ambulatory Visit: Payer: Self-pay

## 2017-03-04 MED ORDER — TADALAFIL 20 MG PO TABS: ORAL_TABLET | ORAL | 0 refills | Status: DC

## 2017-03-06 ENCOUNTER — Ambulatory Visit (INDEPENDENT_AMBULATORY_CARE_PROVIDER_SITE_OTHER): Payer: BLUE CROSS/BLUE SHIELD | Admitting: Family Medicine

## 2017-03-06 VITALS — BP 137/64 | HR 90

## 2017-03-06 DIAGNOSIS — E291 Testicular hypofunction: Secondary | ICD-10-CM

## 2017-03-06 MED ORDER — TESTOSTERONE CYPIONATE 200 MG/ML IM SOLN
200.0000 mg | Freq: Once | INTRAMUSCULAR | Status: AC
  Administered 2017-03-06: 200 mg via INTRAMUSCULAR

## 2017-03-06 NOTE — Progress Notes (Signed)
Patient came into office today for testosterone injection. Denies chest pain, shortness of breath, headaches and problems associated with taking this medication. Patient states he has had no abnornal mood swings. Patient tolerated injection in LUOQ well without complications. Patient advised to schedule his next injection for 2 weeks from today. 

## 2017-03-10 DIAGNOSIS — H01006 Unspecified blepharitis left eye, unspecified eyelid: Secondary | ICD-10-CM | POA: Diagnosis not present

## 2017-03-10 DIAGNOSIS — H0015 Chalazion left lower eyelid: Secondary | ICD-10-CM | POA: Diagnosis not present

## 2017-03-10 DIAGNOSIS — H40003 Preglaucoma, unspecified, bilateral: Secondary | ICD-10-CM | POA: Diagnosis not present

## 2017-03-10 DIAGNOSIS — H00019 Hordeolum externum unspecified eye, unspecified eyelid: Secondary | ICD-10-CM | POA: Diagnosis not present

## 2017-03-20 ENCOUNTER — Ambulatory Visit (INDEPENDENT_AMBULATORY_CARE_PROVIDER_SITE_OTHER): Payer: BLUE CROSS/BLUE SHIELD | Admitting: Family Medicine

## 2017-03-20 VITALS — BP 124/76 | HR 89

## 2017-03-20 DIAGNOSIS — E291 Testicular hypofunction: Secondary | ICD-10-CM

## 2017-03-20 MED ORDER — TESTOSTERONE CYPIONATE 200 MG/ML IM SOLN
200.0000 mg | Freq: Once | INTRAMUSCULAR | Status: AC
  Administered 2017-03-20: 200 mg via INTRAMUSCULAR

## 2017-03-20 NOTE — Progress Notes (Signed)
Pt tolerated 200mg  testosterone injection in RUOQ well.  No complications.  Pt to return in 2 weeks for next injection.

## 2017-03-28 ENCOUNTER — Encounter: Payer: Self-pay | Admitting: Family Medicine

## 2017-04-03 ENCOUNTER — Ambulatory Visit: Payer: BLUE CROSS/BLUE SHIELD

## 2017-04-03 ENCOUNTER — Ambulatory Visit (INDEPENDENT_AMBULATORY_CARE_PROVIDER_SITE_OTHER): Payer: BLUE CROSS/BLUE SHIELD | Admitting: Family Medicine

## 2017-04-03 VITALS — BP 116/72 | HR 86

## 2017-04-03 DIAGNOSIS — E291 Testicular hypofunction: Secondary | ICD-10-CM

## 2017-04-03 MED ORDER — TESTOSTERONE CYPIONATE 200 MG/ML IM SOLN
200.0000 mg | Freq: Once | INTRAMUSCULAR | Status: AC
  Administered 2017-04-03: 200 mg via INTRAMUSCULAR

## 2017-04-03 NOTE — Progress Notes (Signed)
Patient came into office today for testosterone injection. Denies chest pain, shortness of breath, headaches and problems associated with taking this medication. Patient states he has had no abnornal mood swings. Patient tolerated injection in LUOQ well without complications. Patient advised to schedule his next injection for 2 weeks from today. 

## 2017-04-21 ENCOUNTER — Ambulatory Visit (INDEPENDENT_AMBULATORY_CARE_PROVIDER_SITE_OTHER): Payer: BLUE CROSS/BLUE SHIELD | Admitting: Family Medicine

## 2017-04-21 VITALS — BP 115/70 | HR 102

## 2017-04-21 DIAGNOSIS — R7989 Other specified abnormal findings of blood chemistry: Secondary | ICD-10-CM | POA: Diagnosis not present

## 2017-04-21 MED ORDER — TESTOSTERONE CYPIONATE 200 MG/ML IM SOLN
200.0000 mg | Freq: Once | INTRAMUSCULAR | Status: AC
  Administered 2017-04-21: 200 mg via INTRAMUSCULAR

## 2017-04-21 NOTE — Progress Notes (Signed)
Testosterone injection given RUOQ without complications. 

## 2017-05-05 ENCOUNTER — Ambulatory Visit (INDEPENDENT_AMBULATORY_CARE_PROVIDER_SITE_OTHER): Payer: BLUE CROSS/BLUE SHIELD | Admitting: Family Medicine

## 2017-05-05 VITALS — BP 128/77 | HR 91 | Wt 233.0 lb

## 2017-05-05 DIAGNOSIS — R7989 Other specified abnormal findings of blood chemistry: Secondary | ICD-10-CM | POA: Diagnosis not present

## 2017-05-05 MED ORDER — TESTOSTERONE CYPIONATE 200 MG/ML IM SOLN
200.0000 mg | Freq: Once | INTRAMUSCULAR | Status: AC
  Administered 2017-05-05: 200 mg via INTRAMUSCULAR

## 2017-05-05 NOTE — Progress Notes (Signed)
Pt is here for testosterone injection. Denies SOB, mood swings, and palpitations.  Pt tolerated injection well in the LUOQ. Pt advised to make next appointment in 14 days. -EH/RMA

## 2017-05-20 ENCOUNTER — Ambulatory Visit (INDEPENDENT_AMBULATORY_CARE_PROVIDER_SITE_OTHER): Payer: BLUE CROSS/BLUE SHIELD | Admitting: Osteopathic Medicine

## 2017-05-20 VITALS — BP 128/70 | HR 72

## 2017-05-20 DIAGNOSIS — E291 Testicular hypofunction: Secondary | ICD-10-CM

## 2017-05-20 MED ORDER — TESTOSTERONE CYPIONATE 200 MG/ML IM SOLN
200.0000 mg | Freq: Once | INTRAMUSCULAR | Status: AC
  Administered 2017-05-20: 200 mg via INTRAMUSCULAR

## 2017-05-20 NOTE — Progress Notes (Signed)
Patient came into office today for testosterone injection. Denies chest pain, shortness of breath, headaches and problems associated with taking this medication. Patient states he has had no abnornal mood swings. Patient tolerated injection in RUOQ well without complications. Patient advised to schedule his next injection for 2 weeks from today. 

## 2017-06-03 ENCOUNTER — Other Ambulatory Visit: Payer: Self-pay | Admitting: Family Medicine

## 2017-06-04 ENCOUNTER — Ambulatory Visit: Payer: BLUE CROSS/BLUE SHIELD

## 2017-06-04 ENCOUNTER — Other Ambulatory Visit: Payer: Self-pay

## 2017-06-04 MED ORDER — DAPAGLIFLOZIN PRO-METFORMIN ER 5-1000 MG PO TB24: 5.0000 mg | ORAL_TABLET | Freq: Two times a day (BID) | ORAL | 1 refills | Status: DC

## 2017-06-05 ENCOUNTER — Ambulatory Visit (INDEPENDENT_AMBULATORY_CARE_PROVIDER_SITE_OTHER): Payer: BLUE CROSS/BLUE SHIELD | Admitting: Family Medicine

## 2017-06-05 VITALS — BP 116/74 | HR 89 | Ht 70.0 in | Wt 232.0 lb

## 2017-06-05 DIAGNOSIS — E291 Testicular hypofunction: Secondary | ICD-10-CM

## 2017-06-05 MED ORDER — TESTOSTERONE CYPIONATE 200 MG/ML IM SOLN
200.0000 mg | INTRAMUSCULAR | Status: DC
  Administered 2017-06-05: 200 mg via INTRAMUSCULAR

## 2017-06-05 NOTE — Progress Notes (Signed)
Patient is here for testosterone injection. Denies chest pains, shortness of breath, headaches, and problems with medication or mood changes.  Patient was given 200 mg of Depo Testosterone in LUOQ.  Patient tolerated injection well without complications. Patient advised to schedule next injection in 14 days.

## 2017-06-18 ENCOUNTER — Ambulatory Visit: Payer: BLUE CROSS/BLUE SHIELD

## 2017-06-19 ENCOUNTER — Ambulatory Visit (INDEPENDENT_AMBULATORY_CARE_PROVIDER_SITE_OTHER): Payer: BLUE CROSS/BLUE SHIELD | Admitting: Family Medicine

## 2017-06-19 VITALS — BP 119/76 | HR 100 | Wt 232.0 lb

## 2017-06-19 DIAGNOSIS — E291 Testicular hypofunction: Secondary | ICD-10-CM | POA: Diagnosis not present

## 2017-06-19 MED ORDER — TESTOSTERONE CYPIONATE 200 MG/ML IM SOLN
200.0000 mg | INTRAMUSCULAR | Status: DC
  Administered 2017-06-19: 200 mg via INTRAMUSCULAR

## 2017-06-19 NOTE — Progress Notes (Signed)
Patient was in the office for Testosterone injection. Patient was given 200 mg of Depo Testosterone in RUOQ. Patient denied any dizziness, shortness of breath or headache. Patient was advised to schedule a follow up appointment for his diabetes. Shane Williams.Shane Williams,CMA

## 2017-07-01 ENCOUNTER — Other Ambulatory Visit: Payer: Self-pay | Admitting: Family Medicine

## 2017-07-03 ENCOUNTER — Encounter: Payer: Self-pay | Admitting: Family Medicine

## 2017-07-03 ENCOUNTER — Ambulatory Visit (INDEPENDENT_AMBULATORY_CARE_PROVIDER_SITE_OTHER): Payer: BLUE CROSS/BLUE SHIELD | Admitting: Family Medicine

## 2017-07-03 VITALS — BP 109/69 | HR 92 | Wt 231.0 lb

## 2017-07-03 DIAGNOSIS — Z6833 Body mass index (BMI) 33.0-33.9, adult: Secondary | ICD-10-CM | POA: Diagnosis not present

## 2017-07-03 DIAGNOSIS — E785 Hyperlipidemia, unspecified: Secondary | ICD-10-CM

## 2017-07-03 DIAGNOSIS — Z9189 Other specified personal risk factors, not elsewhere classified: Secondary | ICD-10-CM | POA: Diagnosis not present

## 2017-07-03 DIAGNOSIS — E6609 Other obesity due to excess calories: Secondary | ICD-10-CM | POA: Diagnosis not present

## 2017-07-03 DIAGNOSIS — D582 Other hemoglobinopathies: Secondary | ICD-10-CM

## 2017-07-03 DIAGNOSIS — R7989 Other specified abnormal findings of blood chemistry: Secondary | ICD-10-CM | POA: Diagnosis not present

## 2017-07-03 DIAGNOSIS — E1165 Type 2 diabetes mellitus with hyperglycemia: Secondary | ICD-10-CM | POA: Diagnosis not present

## 2017-07-03 DIAGNOSIS — E291 Testicular hypofunction: Secondary | ICD-10-CM

## 2017-07-03 DIAGNOSIS — IMO0001 Reserved for inherently not codable concepts without codable children: Secondary | ICD-10-CM

## 2017-07-03 LAB — POCT GLYCOSYLATED HEMOGLOBIN (HGB A1C): Hemoglobin A1C: HIGH

## 2017-07-03 MED ORDER — TESTOSTERONE CYPIONATE 200 MG/ML IM SOLN
200.0000 mg | Freq: Once | INTRAMUSCULAR | Status: AC
  Administered 2017-07-03: 200 mg via INTRAMUSCULAR

## 2017-07-03 NOTE — Patient Instructions (Signed)
Thank you for coming in today. Get fasting labs in about 1 week.,  We will decide on course of action based on results.  Work on quitting smoking.  Recheck in 3 months.  Return sooner if needed.

## 2017-07-03 NOTE — Progress Notes (Signed)
Shane Williams is a 46 y.o. male who presents to [name]: Primary Care Sports Medicine today for diabetes, hemoglobin, testosterone and smoking.  Diabetes: Patient has done well with his diabetes in the last several months. He notes his fasting sugars are typically 120s. He denies any polyuria or polydipsia. He takes [place].  Hemoglobin: Patient was found to have elevated hemoglobin when last checked. This was thought due to testosterone and smoking. His testosterone was reduced. He feels well with no flushing.  Testosterone: Patient receives injectable testosterone every 2 weeks. He tolerates this well and notes that it allows him to have better energy and libido.  Smoking: Patient continues to smoke about a quarter of a pack a day. He quit for about 6 weeks in July but resumed smoking.   Past Medical History:  Diagnosis Date  . Low testosterone 11/27/2012  . Type 2 diabetes mellitus (HCC) 11/27/2012   Past Surgical History:  Procedure Laterality Date  . APPENDECTOMY    . INCISE AND DRAIN ABCESS     4 times in last 2 years  . SHOULDER ARTHROSCOPY     Social History  Substance Use Topics  . Smoking status: Current Every Day Smoker    Packs/day: 0.25    Last attempt to quit: 05/12/2017  . Smokeless tobacco: Never Used  . Alcohol use Yes   family history includes Diabetes in his father.  ROS as above:  Medications: Current Outpatient Prescriptions  Medication Sig Dispense Refill  . AMBULATORY NON FORMULARY MEDICATION One Touch Ultra Blue Test Strips.  Diagnosis: Diabetes Mellitus. Test 2 times a day 100 strip 4  . atorvastatin (LIPITOR) 40 MG tablet Take 1 tablet (40 mg total) by mouth daily. 90 tablet 2  . Dapagliflozin-Metformin HCl ER (XIGDUO XR) 02-999 MG TB24 Take 5-1,000 mg by mouth 2 (two) times daily. Evening dose can be cut to half a tablet if needed. 60 tablet 1  .  JANUVIA 100 MG tablet TAKE 1 TABLET (100 MG TOTAL) BY MOUTH DAILY. APPOINTMENT REQUIRED FOR REFILLS. 15 tablet 0  . Lancets (ONETOUCH ULTRASOFT) lancets Use as directed 60 each 5  . tadalafil (CIALIS) 20 MG tablet TAKE 1 TABLET BY MOUTH DAILY AS NEEDED FOR ERECTILE DYSFUNCTION 10 tablet 0  . testosterone cypionate (DEPOTESTOTERONE CYPIONATE) 100 MG/ML injection Inject 200 mg into the muscle every 14 (fourteen) days. For IM use only      No current facility-administered medications for this visit.    Allergies  Allergen Reactions  . Penicillins Swelling  . Viagra [Sildenafil Citrate]     headache    Health Maintenance Health Maintenance  Topic Date Due  . INFLUENZA VACCINE  07/03/2018 (Originally 05/21/2017)  . HEMOGLOBIN A1C  07/18/2017  . FOOT EXAM  08/06/2017  . URINE MICROALBUMIN  08/06/2017  . OPHTHALMOLOGY EXAM  02/10/2018  . PNEUMOCOCCAL POLYSACCHARIDE VACCINE (2) 08/06/2021  . TETANUS/TDAP  08/06/2026  . HIV Screening  Completed     Exam:  BP 109/69   Pulse 92   Wt 231 lb (104.8 kg)   BMI 33.15 kg/m   Wt Readings from Last 10 Encounters:  07/03/17 231 lb (104.8 kg)  06/19/17 232 lb (105.2 kg)  06/05/17 232 lb (105.2 kg)  05/05/17 233 lb (105.7 kg)  01/29/17 232 lb (105.2 kg)  01/21/17 232 lb (105.2 kg)  12/16/16 232 lb (105.2 kg)  11/28/16 235 lb 6.4 oz (106.8 kg)  11/14/16 235 lb (106.6 kg)  10/31/16 234 lb (106.1 kg)    Gen: Well NAD HEENT: EOMI,  MMM Lungs: Normal work of breathing. CTABL Heart: RRR no MRG Abd: NABS, Soft. Nondistended, Nontender Exts: Brisk capillary refill, warm and well perfused.   Diabetic Foot Exam - Simple   Simple Foot Form Diabetic Foot exam was performed with the following findings:  Yes 07/03/2017  9:01 AM  Visual Inspection No deformities, no ulcerations, no other skin breakdown bilaterally:  Yes Sensation Testing Intact to touch and monofilament testing bilaterally:  Yes Pulse Check Posterior Tibialis and Dorsalis pulse  intact bilaterally:  Yes Comments      STOP BANG: Snore:     Yes Tired:     Yes Observed stop breathing:  No Hypertension:   No  BMI >35:   No Age >50:   No Neck > 16 inches:  Yes Male gender:   Yes ------------------------------------------ Total:     4/8    Results for orders placed or performed in visit on 07/03/17 (from the past 72 hour(s))  POCT HgB A1C     Status: None   Collection Time: 07/03/17  9:08 AM  Result Value Ref Range   Hemoglobin A1C high    No results found.    Assessment and Plan: 46 y.o. male with  Diabetes: Doing well. Point-of-care hemoglobin A1c machine was unable to calculate a result based on elevated total hemoglobin. We'll obtain a lab based A1c. Continue current regimen.  Elevated hemoglobin: We'll recheck hemoglobin today however I'm not optimistic that it's much better. We'll likely have to discontinue testosterone. We had a lengthy discussion about this. Plan to quit smoking as this certainly will help reduce hemoglobin levels. Additionally we'll consider working up possible sleep apnea however stop bang questionnaire was not significantly abnormal.  Smoking: Patient is continuing to be interested in quitting smoking. Discussed strategies today.     Orders Placed This Encounter  Procedures  . CBC  . COMPLETE METABOLIC PANEL WITH GFR  . Lipid Panel w/reflex Direct LDL  . Testosterone  . Hemoglobin A1c  . PSA  . POCT HgB A1C   Meds ordered this encounter  Medications  . testosterone cypionate (DEPOTESTOSTERONE CYPIONATE) injection 200 mg     Discussed warning signs or symptoms. Please see discharge instructions. Patient expresses understanding.

## 2017-07-18 ENCOUNTER — Ambulatory Visit (INDEPENDENT_AMBULATORY_CARE_PROVIDER_SITE_OTHER): Payer: BLUE CROSS/BLUE SHIELD | Admitting: Family Medicine

## 2017-07-18 VITALS — BP 113/65 | HR 81

## 2017-07-18 DIAGNOSIS — E785 Hyperlipidemia, unspecified: Secondary | ICD-10-CM | POA: Diagnosis not present

## 2017-07-18 DIAGNOSIS — E291 Testicular hypofunction: Secondary | ICD-10-CM

## 2017-07-18 DIAGNOSIS — R7989 Other specified abnormal findings of blood chemistry: Secondary | ICD-10-CM | POA: Diagnosis not present

## 2017-07-18 DIAGNOSIS — E1165 Type 2 diabetes mellitus with hyperglycemia: Secondary | ICD-10-CM | POA: Diagnosis not present

## 2017-07-18 MED ORDER — TESTOSTERONE CYPIONATE 200 MG/ML IM SOLN
200.0000 mg | Freq: Once | INTRAMUSCULAR | Status: AC
  Administered 2017-07-18: 200 mg via INTRAMUSCULAR

## 2017-07-18 NOTE — Progress Notes (Signed)
Patient came into office today for testosterone injection. Denies chest pain, shortness of breath, headaches and problems associated with taking this medication. Patient states he has had no abnornal mood swings. Patient tolerated injection in RUOQ well without complications. Patient advised to schedule his next injection for 2 weeks from today. 

## 2017-07-19 LAB — LIPID PANEL W/REFLEX DIRECT LDL
Cholesterol: 110 mg/dL (ref <200)
HDL: 26 mg/dL — ABNORMAL LOW (ref >40)
LDL Cholesterol (Calc): 63 mg/dL (calc)
NON-HDL CHOLESTEROL (CALC): 84 mg/dL (ref <130)
Total CHOL/HDL Ratio: 4.2 (calc) (ref <5.0)
Triglycerides: 119 mg/dL (ref <150)

## 2017-07-19 LAB — COMPLETE METABOLIC PANEL WITH GFR
AG RATIO: 1.6 (calc) (ref 1.0–2.5)
ALKALINE PHOSPHATASE (APISO): 62 U/L (ref 40–115)
ALT: 37 U/L (ref 9–46)
AST: 21 U/L (ref 10–40)
Albumin: 4.5 g/dL (ref 3.6–5.1)
BILIRUBIN TOTAL: 1.1 mg/dL (ref 0.2–1.2)
BUN: 13 mg/dL (ref 7–25)
CHLORIDE: 102 mmol/L (ref 98–110)
CO2: 25 mmol/L (ref 20–32)
Calcium: 9.3 mg/dL (ref 8.6–10.3)
Creat: 1.04 mg/dL (ref 0.60–1.35)
GFR, Est African American: 99 mL/min/{1.73_m2} (ref >=60)
GFR, Est Non African American: 85 mL/min/{1.73_m2} (ref >=60)
GLOBULIN: 2.8 g/dL (ref 1.9–3.7)
Glucose, Bld: 140 mg/dL — ABNORMAL HIGH (ref 65–99)
POTASSIUM: 4.4 mmol/L (ref 3.5–5.3)
SODIUM: 136 mmol/L (ref 135–146)
Total Protein: 7.3 g/dL (ref 6.1–8.1)

## 2017-07-19 LAB — CBC
HEMATOCRIT: 56.2 % — AB (ref 38.5–50.0)
Hemoglobin: 19 g/dL — ABNORMAL HIGH (ref 13.2–17.1)
MCH: 28.3 pg (ref 27.0–33.0)
MCHC: 33.8 g/dL (ref 32.0–36.0)
MCV: 83.6 fL (ref 80.0–100.0)
MPV: 9.5 fL (ref 7.5–12.5)
Platelets: 219 10*3/uL (ref 140–400)
RBC: 6.72 10*6/uL — ABNORMAL HIGH (ref 4.20–5.80)
RDW: 14.2 % (ref 11.0–15.0)
WBC: 7.3 10*3/uL (ref 3.8–10.8)

## 2017-07-19 LAB — HEMOGLOBIN A1C
EAG (MMOL/L): 8.9 (calc)
Hgb A1c MFr Bld: 7.2 % of total Hgb — ABNORMAL HIGH (ref <5.7)
MEAN PLASMA GLUCOSE: 160 (calc)

## 2017-07-19 LAB — TESTOSTERONE: TESTOSTERONE: 344 ng/dL (ref 250–827)

## 2017-07-19 LAB — PSA: PSA: 0.4 ng/mL (ref <=4.0)

## 2017-07-24 ENCOUNTER — Other Ambulatory Visit: Payer: Self-pay | Admitting: Family Medicine

## 2017-07-28 ENCOUNTER — Emergency Department (INDEPENDENT_AMBULATORY_CARE_PROVIDER_SITE_OTHER)
Admission: EM | Admit: 2017-07-28 | Discharge: 2017-07-28 | Disposition: A | Discharge status: Home / Self Care | Payer: BLUE CROSS/BLUE SHIELD | Source: Home / Self Care | Attending: Family Medicine | Admitting: Family Medicine

## 2017-07-28 ENCOUNTER — Encounter: Payer: Self-pay | Admitting: *Deleted

## 2017-07-28 DIAGNOSIS — R0981 Nasal congestion: Secondary | ICD-10-CM

## 2017-07-28 DIAGNOSIS — J069 Acute upper respiratory infection, unspecified: Secondary | ICD-10-CM | POA: Diagnosis not present

## 2017-07-28 DIAGNOSIS — B9789 Other viral agents as the cause of diseases classified elsewhere: Secondary | ICD-10-CM | POA: Diagnosis not present

## 2017-07-28 MED ORDER — AZITHROMYCIN 250 MG PO TABS: 250.0000 mg | ORAL_TABLET | Freq: Every day | ORAL | 0 refills | Status: DC

## 2017-07-28 MED ORDER — IPRATROPIUM BROMIDE 0.06 % NA SOLN: 2.0000 | Freq: Four times a day (QID) | NASAL | 1 refills | Status: DC

## 2017-07-28 MED ORDER — BENZONATATE 100 MG PO CAPS: 100.0000 mg | ORAL_CAPSULE | Freq: Three times a day (TID) | ORAL | 0 refills | Status: DC

## 2017-07-28 NOTE — ED Triage Notes (Signed)
Pt c/o productive cough x 1 wk, worse x 3 days. Denies fever.

## 2017-07-28 NOTE — Discharge Instructions (Signed)
°  Your symptoms are likely due to a virus such as the common cold, however, if you developing worsening chest congestion with shortness of breath, persistent fever (>100.4*) for 3 days, or symptoms not improving in 4-5 days, you may fill the antibiotic (azithromycin).  If you do fill the antibiotic,  please take antibiotics as prescribed and be sure to complete entire course even if you start to feel better to ensure infection does not come back. ° ° °

## 2017-07-28 NOTE — ED Provider Notes (Signed)
[facility]    CSN: [id number] Arrival date & time: 07/28/17  1306     History   Chief Complaint Chief Complaint  Patient presents with  . Cough    HPI Shane Williams is a 46 y.o. male.   HPI Shane Williams is a 46 y.o. male presenting to UC with c/o 1 week of gradually worsening productive cough with sinus congestion and pressure. Cough worse over the last 3 days.  Denies fever, chills, n/v/d. He has been trying OTC cough medication with mild relief. His wife was dx with a URI last week.  No hx of asthma. Denies chest pain or SOB.   Past Medical History:  Diagnosis Date  . Low testosterone 11/27/2012  . Type 2 diabetes mellitus (HCC) 11/27/2012    Patient Active Problem List   Diagnosis Date Noted  . Elevated hemoglobin (HCC) 09/03/2016  . Framingham cardiac risk 10-20% in next 10 years 08/15/2016  . Obesity 08/06/2016  . Erectile dysfunction 12/23/2013  . Right rotator cuff tear 06/30/2013  . Hyperlipidemia LDL goal <100 12/08/2012  . Uncontrolled diabetes mellitus (HCC) 11/27/2012  . Low testosterone 11/27/2012    Past Surgical History:  Procedure Laterality Date  . APPENDECTOMY    . INCISE AND DRAIN ABCESS     4 times in last 2 years  . SHOULDER ARTHROSCOPY         Home Medications    Prior to Admission medications   Medication Sig Start Date End Date Taking? Authorizing Provider  AMBULATORY NON FORMULARY MEDICATION One Touch Ultra Blue Test Strips.  Diagnosis: Diabetes Mellitus. Test 2 times a day 09/03/16   Rodolph Bong, MD  atorvastatin (LIPITOR) 40 MG tablet Take 1 tablet (40 mg total) by mouth daily. 01/29/17   Rodolph Bong, MD  azithromycin (ZITHROMAX) 250 MG tablet Take 1 tablet (250 mg total) by mouth daily. Take first 2 tablets together, then 1 every day until finished. 07/28/17   Lurene Shadow, PA-C  benzonatate (TESSALON) 100 MG capsule Take 1-2 capsules (100-200 mg total) by mouth every 8 (eight) hours. 07/28/17   Lurene Shadow, PA-C    Dapagliflozin-Metformin HCl ER (XIGDUO XR) 02-999 MG TB24 Take 5-1,000 mg by mouth 2 (two) times daily. Evening dose can be cut to half a tablet if needed. 06/04/17   Rodolph Bong, MD  ipratropium (ATROVENT) 0.06 % nasal spray Place 2 sprays into both nostrils 4 (four) times daily. 07/28/17   Lurene Shadow, PA-C  Lancets [facility] ULTRASOFT) lancets Use as directed 12/08/12   Laren Boom, DO  sitaGLIPtin (JANUVIA) 100 MG tablet Take 1 tablet (100 mg total) by mouth daily. 07/24/17   Rodolph Bong, MD  tadalafil (CIALIS) 20 MG tablet TAKE 1 TABLET BY MOUTH DAILY AS NEEDED FOR ERECTILE DYSFUNCTION 03/04/17   Rodolph Bong, MD  testosterone cypionate (DEPOTESTOTERONE CYPIONATE) 100 MG/ML injection Inject 200 mg into the muscle every 14 (fourteen) days. For IM use only     [provider]    Family History Family History  Problem Relation Age of Onset  . Diabetes Father     Social History Social History  Substance Use Topics  . Smoking status: Current Every Day Smoker    Packs/day: 0.25    Last attempt to quit: 05/12/2017  . Smokeless tobacco: Never Used  . Alcohol use Yes     Allergies   Penicillins and Viagra [sildenafil citrate]   Review of Systems Review of Systems  Constitutional: Negative for chills and fever.  HENT: Positive for congestion, postnasal drip and sinus pressure. Negative for ear pain, sore throat, trouble swallowing and voice change.   Respiratory: Positive for cough. Negative for shortness of breath.   Cardiovascular: Negative for chest pain and palpitations.  Gastrointestinal: Negative for abdominal pain, diarrhea, nausea and vomiting.  Musculoskeletal: Negative for arthralgias, back pain and myalgias.  Skin: Negative for rash.     Physical Exam Triage Vital Signs ED Triage Vitals [07/28/17 1341]  Enc Vitals Group     BP 119/74     Pulse Rate 80     Resp 16     Temp 98 F (36.7 C)     Temp Source Oral     SpO2 97 %     Weight 224 lb (101.6  kg)     Height 6' (1.829 m)     Head Circumference      Peak Flow      Pain Score 0     Pain Loc      Pain Edu?      Excl. in GC?    No data found.   Updated Vital Signs BP 119/74 (BP Location: Left Arm)   Pulse 80   Temp 98 F (36.7 C) (Oral)   Resp 16   Ht 6' (1.829 m)   Wt 224 lb (101.6 kg)   SpO2 97%   BMI 30.38 kg/m   Visual Acuity Right Eye Distance:   Left Eye Distance:   Bilateral Distance:    Right Eye Near:   Left Eye Near:    Bilateral Near:     Physical Exam  Constitutional: He is oriented to person, place, and time. He appears well-developed and well-nourished. No distress.  HENT:  Head: Normocephalic and atraumatic.  Right Ear: Tympanic membrane normal.  Left Ear: Tympanic membrane normal.  Nose: Mucosal edema present. Right sinus exhibits no maxillary sinus tenderness and no frontal sinus tenderness. Left sinus exhibits no maxillary sinus tenderness and no frontal sinus tenderness.  Mouth/Throat: Uvula is midline, oropharynx is clear and moist and mucous membranes are normal.  Eyes: EOM are normal.  Neck: Normal range of motion. Neck supple.  Cardiovascular: Normal rate and regular rhythm.   Pulmonary/Chest: Effort normal. No stridor. No respiratory distress. He has no wheezes. He has rhonchi. He has no rales.  Faint diffuse rhonchi, clears with cough.   Musculoskeletal: Normal range of motion.  Lymphadenopathy:    He has no cervical adenopathy.  Neurological: He is alert and oriented to person, place, and time.  Skin: Skin is warm and dry. He is not diaphoretic.  Psychiatric: He has a normal mood and affect. His behavior is normal.  Nursing note and vitals reviewed.    UC Treatments / Results  Labs (all labs ordered are listed, but only abnormal results are displayed) Labs Reviewed - No data to display  EKG  EKG Interpretation None       Radiology No results found.  Procedures Procedures (including critical care  time)  Medications Ordered in UC Medications - No data to display   Initial Impression / Assessment and Plan / UC Course  I have reviewed the triage vital signs and the nursing notes.  Pertinent labs & imaging results that were available during my care of the patient were reviewed by me and considered in my medical decision making (see chart for details).     Hx and exam c/w viral illness Encouraged symptomatic treatment at  this time.  If not improving in 5-7 days, fever develops or worsening sinus pain develops, may fill prescription for azithromycin F/u with PCP in 7-10 days if not improving.  Pt info packet provided.   Final Clinical Impressions(s) / UC Diagnoses   Final diagnoses:  Viral URI with cough  Sinus congestion    New Prescriptions Discharge Medication List as of 07/28/2017  2:02 PM    START taking these medications   Details  azithromycin (ZITHROMAX) 250 MG tablet Take 1 tablet (250 mg total) by mouth daily. Take first 2 tablets together, then 1 every day until finished., Starting Mon 07/28/2017, Print    benzonatate (TESSALON) 100 MG capsule Take 1-2 capsules (100-200 mg total) by mouth every 8 (eight) hours., Starting Mon 07/28/2017, Normal    ipratropium (ATROVENT) 0.06 % nasal spray Place 2 sprays into both nostrils 4 (four) times daily., Starting Mon 07/28/2017, Normal         Controlled Substance Prescriptions [place] Controlled Substance Registry consulted? Not Applicable   Rolla Plate 07/28/17 1453

## 2017-08-01 ENCOUNTER — Ambulatory Visit (INDEPENDENT_AMBULATORY_CARE_PROVIDER_SITE_OTHER): Payer: BLUE CROSS/BLUE SHIELD | Admitting: Family Medicine

## 2017-08-01 ENCOUNTER — Encounter: Payer: Self-pay | Admitting: Family Medicine

## 2017-08-01 VITALS — BP 129/79 | HR 90 | Temp 97.6°F

## 2017-08-01 DIAGNOSIS — H6093 Unspecified otitis externa, bilateral: Secondary | ICD-10-CM

## 2017-08-01 DIAGNOSIS — E291 Testicular hypofunction: Secondary | ICD-10-CM

## 2017-08-01 MED ORDER — TESTOSTERONE CYPIONATE 200 MG/ML IM SOLN
200.0000 mg | Freq: Once | INTRAMUSCULAR | Status: AC
  Administered 2017-08-01: 200 mg via INTRAMUSCULAR

## 2017-08-01 MED ORDER — NEOMYCIN-POLYMYXIN-HC 1 % OT SOLN: 3.0000 [drp] | Freq: Four times a day (QID) | OTIC | 0 refills | Status: DC

## 2017-08-01 NOTE — Patient Instructions (Signed)
Thank you for coming in today. Use the ear drops during the day for about 1 week.  You can also use rubbing alcohol drops in between.  Recheck as scheduled.  Let me know if Januvia is still expensive.    Otitis Externa Otitis externa is an infection of the outer ear canal. The outer ear canal is the area between the outside of the ear and the eardrum. Otitis externa is sometimes called "swimmer's ear." What are the causes? This condition may be caused by:  Swimming in dirty water.  Moisture in the ear.  An injury to the inside of the ear.  An object stuck in the ear.  A cut or scrape on the outside of the ear.  What increases the risk? This condition is more likely to develop in swimmers. What are the signs or symptoms? The first symptom of this condition is often itching in the ear. Later signs and symptoms include:  Swelling of the ear.  Redness in the ear.  Ear pain. The pain may get worse when you pull on your ear.  Pus coming from the ear.  How is this diagnosed? This condition may be diagnosed by examining the ear and testing fluid from the ear for bacteria and funguses. How is this treated? This condition may be treated with:  Antibiotic ear drops. These are often given for 10-14 days.  Medicine to reduce itching and swelling.  Follow these instructions at home:  If you were prescribed antibiotic ear drops, apply them as told by your health care provider. Do not stop using the antibiotic even if your condition improves.  Take over-the-counter and prescription medicines only as told by your health care provider.  Keep all follow-up visits as told by your health care provider. This is important. How is this prevented?  Keep your ear dry. Use the corner of a towel to dry your ear after you swim or bathe.  Avoid scratching or putting things in your ear. Doing these things can damage the ear canal or remove the protective wax that lines it, which makes it  easier for bacteria and funguses to grow.  Avoid swimming in lakes, polluted water, or pools that may not have the right amount of chlorine.  Consider making ear drops and putting 3 or 4 drops in each ear after you swim. Ask your health care provider about how you can make ear drops. Contact a health care provider if:  You have a fever.  After 3 days your ear is still red, swollen, painful, or draining pus.  Your redness, swelling, or pain gets worse.  You have a severe headache.  You have redness, swelling, pain, or tenderness in the area behind your ear. This information is not intended to replace advice given to you by your health care provider. Make sure you discuss any questions you have with your health care provider. Document Released: 10/07/2005 Document Revised: 11/14/2015 Document Reviewed: 07/17/2015 Elsevier Interactive Patient Education  2018 [employer].    Earwax Buildup, Adult The ears produce a substance called earwax that helps keep bacteria out of the ear and protects the skin in the ear canal. Occasionally, earwax can build up in the ear and cause discomfort or hearing loss. What increases the risk? This condition is more likely to develop in people who:  Are male.  Are elderly.  Naturally produce more earwax.  Clean their ears often with cotton swabs.  Use earplugs often.  Use in-ear headphones often.  Wear  hearing aids.  Have narrow ear canals.  Have earwax that is overly thick or sticky.  Have eczema.  Are dehydrated.  Have excess hair in the ear canal.  What are the signs or symptoms? Symptoms of this condition include:  Reduced or muffled hearing.  A feeling of fullness in the ear or feeling that the ear is plugged.  Fluid coming from the ear.  Ear pain.  Ear itch.  Ringing in the ear.  Coughing.  An obvious piece of earwax that can be seen inside the ear canal.  How is this diagnosed? This condition may be diagnosed  based on:  Your symptoms.  Your medical history.  An ear exam. During the exam, your health care provider will look into your ear with an instrument called an otoscope.  You may have tests, including a hearing test. How is this treated? This condition may be treated by:  Using ear drops to soften the earwax.  Having the earwax removed by a health care provider. The health care provider may: ? Flush the ear with water. ? Use an instrument that has a loop on the end (curette). ? Use a suction device.  Surgery to remove the wax buildup. This may be done in severe cases.  Follow these instructions at home:  Take over-the-counter and prescription medicines only as told by your health care provider.  Do not put any objects, including cotton swabs, into your ear. You can clean the opening of your ear canal with a washcloth or facial tissue.  Follow instructions from your health care provider about cleaning your ears. Do not over-clean your ears.  Drink enough fluid to keep your urine clear or pale yellow. This will help to thin the earwax.  Keep all follow-up visits as told by your health care provider. If earwax builds up in your ears often or if you use hearing aids, consider seeing your health care provider for routine, preventive ear cleanings. Ask your health care provider how often you should schedule your cleanings.  If you have hearing aids, clean them according to instructions from the manufacturer and your health care provider. Contact a health care provider if:  You have ear pain.  You develop a fever.  You have blood, pus, or other fluid coming from your ear.  You have hearing loss.  You have ringing in your ears that does not go away.  Your symptoms do not improve with treatment.  You feel like the room is spinning (vertigo). Summary  Earwax can build up in the ear and cause discomfort or hearing loss.  The most common symptoms of this condition include  reduced or muffled hearing and a feeling of fullness in the ear or feeling that the ear is plugged.  This condition may be diagnosed based on your symptoms, your medical history, and an ear exam.  This condition may be treated by using ear drops to soften the earwax or by having the earwax removed by a health care provider.  Do not put any objects, including cotton swabs, into your ear. You can clean the opening of your ear canal with a washcloth or facial tissue. This information is not intended to replace advice given to you by your health care provider. Make sure you discuss any questions you have with your health care provider. Document Released: 11/14/2004 Document Revised: 12/18/2016 Document Reviewed: 12/18/2016 Elsevier Interactive Patient Education  [employer].

## 2017-08-01 NOTE — Progress Notes (Signed)
Shane Williams is a 46 y.o. male who presents to [facility]: Primary Care Sports Medicine today for ear pain. Shane Williams was originally scheduled today for a testosterone injection via nurse visit. He noted bilateral right worse than left ear pain and was seen by me. The ear pain is been going on for a few days and is associated with decreased hearing. He denies any fevers or chills nausea vomiting or diarrhea and feels well otherwise. He has not tried any specific treatment yet.   Past Medical History:  Diagnosis Date  . Low testosterone 11/27/2012  . Type 2 diabetes mellitus (HCC) 11/27/2012   Past Surgical History:  Procedure Laterality Date  . APPENDECTOMY    . INCISE AND DRAIN ABCESS     4 times in last 2 years  . SHOULDER ARTHROSCOPY     Social History  Substance Use Topics  . Smoking status: Current Every Day Smoker    Packs/day: 0.25    Last attempt to quit: 05/12/2017  . Smokeless tobacco: Never Used  . Alcohol use Yes   family history includes Diabetes in his father.  ROS as above:  Medications: Current Outpatient Prescriptions  Medication Sig Dispense Refill  . AMBULATORY NON FORMULARY MEDICATION One Touch Ultra Blue Test Strips.  Diagnosis: Diabetes Mellitus. Test 2 times a day 100 strip 4  . atorvastatin (LIPITOR) 40 MG tablet Take 1 tablet (40 mg total) by mouth daily. 90 tablet 2  . Dapagliflozin-Metformin HCl ER (XIGDUO XR) 02-999 MG TB24 Take 5-1,000 mg by mouth 2 (two) times daily. Evening dose can be cut to half a tablet if needed. 60 tablet 1  . ipratropium (ATROVENT) 0.06 % nasal spray Place 2 sprays into both nostrils 4 (four) times daily. 15 mL 1  . Lancets (ONETOUCH ULTRASOFT) lancets Use as directed 60 each 5  . sitaGLIPtin (JANUVIA) 100 MG tablet Take 1 tablet (100 mg total) by mouth daily. 90 tablet 0  . tadalafil (CIALIS) 20 MG tablet TAKE 1 TABLET BY MOUTH DAILY AS  NEEDED FOR ERECTILE DYSFUNCTION 10 tablet 0  . testosterone cypionate (DEPOTESTOTERONE CYPIONATE) 100 MG/ML injection Inject 200 mg into the muscle every 14 (fourteen) days. For IM use only     . azithromycin (ZITHROMAX) 250 MG tablet Take 1 tablet (250 mg total) by mouth daily. Take first 2 tablets together, then 1 every day until finished. (Patient not taking: Reported on 08/01/2017) 6 tablet 0  . NEOMYCIN-POLYMYXIN-HYDROCORTISONE (CORTISPORIN) 1 % SOLN OTIC solution Place 3 drops into both ears 4 (four) times daily. 10 mL 0   No current facility-administered medications for this visit.    Allergies  Allergen Reactions  . Penicillins Swelling  . Viagra [Sildenafil Citrate]     headache    Health Maintenance Health Maintenance  Topic Date Due  . INFLUENZA VACCINE  07/03/2018 (Originally 05/21/2017)  . URINE MICROALBUMIN  08/06/2017  . HEMOGLOBIN A1C  01/15/2018  . OPHTHALMOLOGY EXAM  02/10/2018  . FOOT EXAM  07/03/2018  . PNEUMOCOCCAL POLYSACCHARIDE VACCINE (2) 08/06/2021  . TETANUS/TDAP  08/06/2026  . HIV Screening  Completed     Exam:  BP 129/79   Pulse 90   Temp 97.6 F (36.4 C) (Oral)  Gen: Well NAD HEENT: EOMI,  MMM Ear canals are occluded by cerumen bilaterally. Following cerumen removal the right ear canal is very erythematous with an intact tympanic membrane. The left ear canal is much less erythematous with an intact tympanic membrane.  Lungs: Normal work of breathing. CTABL Heart: RRR no MRG Abd: NABS, Soft. Nondistended, Nontender Exts: Brisk capillary refill, warm and well perfused.    No results found for this or any previous visit (from the past 72 hour(s)). No results found.    Assessment and Plan: 46 y.o. male with cerumen impaction with resulting otitis externa. Cerumen removed. Treat with Cortisporin eardrops and recheck as needed.   No orders of the defined types were placed in this encounter.  Meds ordered this encounter  Medications  .  testosterone cypionate (DEPOTESTOSTERONE CYPIONATE) injection 200 mg  . NEOMYCIN-POLYMYXIN-HYDROCORTISONE (CORTISPORIN) 1 % SOLN OTIC solution    Sig: Place 3 drops into both ears 4 (four) times daily.    Dispense:  10 mL    Refill:  0     Discussed warning signs or symptoms. Please see discharge instructions. Patient expresses understanding.

## 2017-08-01 NOTE — Progress Notes (Signed)
Patient came into office today for testosterone injection. Denies chest pain, shortness of breath, headaches and problems associated with taking this medication. Patient states he has had no abnornal mood swings. Patient tolerated injection in LUOQ well without complications. Patient advised to schedule his next injection for 2 weeks from today. Patient also complained of some right ear pain, was able to move onto a Providers schedule for evaluation.

## 2017-08-19 ENCOUNTER — Ambulatory Visit (INDEPENDENT_AMBULATORY_CARE_PROVIDER_SITE_OTHER): Payer: BLUE CROSS/BLUE SHIELD | Admitting: Family Medicine

## 2017-08-19 VITALS — BP 127/78 | HR 97

## 2017-08-19 DIAGNOSIS — E291 Testicular hypofunction: Secondary | ICD-10-CM | POA: Diagnosis not present

## 2017-08-19 MED ORDER — TESTOSTERONE CYPIONATE 200 MG/ML IM SOLN
200.0000 mg | Freq: Once | INTRAMUSCULAR | Status: AC
  Administered 2017-08-19: 200 mg via INTRAMUSCULAR

## 2017-08-19 NOTE — Progress Notes (Signed)
Patient came into office today for testosterone injection. Denies chest pain, shortness of breath, headaches and problems associated with taking this medication. Patient states he has had no abnornal mood swings. Patient tolerated injection in RUOQ well without complications. Patient advised to schedule his next injection for 2 weeks from today. 

## 2017-09-03 ENCOUNTER — Ambulatory Visit: Payer: BLUE CROSS/BLUE SHIELD

## 2017-09-04 ENCOUNTER — Ambulatory Visit (INDEPENDENT_AMBULATORY_CARE_PROVIDER_SITE_OTHER): Payer: BLUE CROSS/BLUE SHIELD | Admitting: Family Medicine

## 2017-09-04 VITALS — BP 121/67 | HR 95

## 2017-09-04 DIAGNOSIS — E291 Testicular hypofunction: Secondary | ICD-10-CM

## 2017-09-04 MED ORDER — TESTOSTERONE CYPIONATE 200 MG/ML IM SOLN
200.0000 mg | Freq: Once | INTRAMUSCULAR | Status: AC
  Administered 2017-09-04: 200 mg via INTRAMUSCULAR

## 2017-09-04 NOTE — Progress Notes (Signed)
Patient came into office today for testosterone injection. Denies chest pain, shortness of breath, headaches and problems associated with taking this medication. Patient states he has had no abnornal mood swings. Patient tolerated injection in LUOQ well without complications. Patient advised to schedule his next injection for 2 weeks from today. 

## 2017-09-18 ENCOUNTER — Ambulatory Visit (INDEPENDENT_AMBULATORY_CARE_PROVIDER_SITE_OTHER): Payer: BLUE CROSS/BLUE SHIELD | Admitting: Family Medicine

## 2017-09-18 VITALS — BP 123/62 | HR 94

## 2017-09-18 DIAGNOSIS — E291 Testicular hypofunction: Secondary | ICD-10-CM

## 2017-09-18 MED ORDER — TESTOSTERONE CYPIONATE 200 MG/ML IM SOLN
200.0000 mg | Freq: Once | INTRAMUSCULAR | Status: AC
  Administered 2017-09-18: 200 mg via INTRAMUSCULAR

## 2017-09-18 NOTE — Progress Notes (Signed)
Patient came into office today for testosterone injection. Denies chest pain, shortness of breath, headaches and problems associated with taking this medication. Patient states he has had no abnornal mood swings. Patient tolerated injection in RUOQ well without complications. Patient advised to schedule his next injection for 2 weeks from today. 

## 2017-10-02 ENCOUNTER — Ambulatory Visit (INDEPENDENT_AMBULATORY_CARE_PROVIDER_SITE_OTHER): Payer: BLUE CROSS/BLUE SHIELD | Admitting: Family Medicine

## 2017-10-02 VITALS — BP 108/57 | HR 96

## 2017-10-02 DIAGNOSIS — E291 Testicular hypofunction: Secondary | ICD-10-CM

## 2017-10-02 MED ORDER — TESTOSTERONE CYPIONATE 200 MG/ML IM SOLN
200.0000 mg | Freq: Once | INTRAMUSCULAR | Status: AC
  Administered 2017-10-02: 200 mg via INTRAMUSCULAR

## 2017-10-02 NOTE — Progress Notes (Signed)
Patient came into office today for testosterone injection. Denies chest pain, shortness of breath, headaches and problems associated with taking this medication. Patient states he has had no abnornal mood swings. Patient tolerated injection on LUOQ well without complications. Patient advised to schedule his next injection for 2 weeks from today.

## 2017-10-08 ENCOUNTER — Encounter: Payer: Self-pay | Admitting: Family Medicine

## 2017-10-08 ENCOUNTER — Ambulatory Visit (INDEPENDENT_AMBULATORY_CARE_PROVIDER_SITE_OTHER): Payer: BLUE CROSS/BLUE SHIELD | Admitting: Family Medicine

## 2017-10-08 VITALS — BP 123/78 | HR 86 | Wt 230.0 lb

## 2017-10-08 DIAGNOSIS — E119 Type 2 diabetes mellitus without complications: Secondary | ICD-10-CM | POA: Diagnosis not present

## 2017-10-08 DIAGNOSIS — D582 Other hemoglobinopathies: Secondary | ICD-10-CM | POA: Diagnosis not present

## 2017-10-08 DIAGNOSIS — E785 Hyperlipidemia, unspecified: Secondary | ICD-10-CM

## 2017-10-08 DIAGNOSIS — E11649 Type 2 diabetes mellitus with hypoglycemia without coma: Secondary | ICD-10-CM | POA: Diagnosis not present

## 2017-10-08 LAB — POCT GLYCOSYLATED HEMOGLOBIN (HGB A1C): Hemoglobin A1C: 7

## 2017-10-08 MED ORDER — SITAGLIPTIN PHOSPHATE 100 MG PO TABS: 100.0000 mg | ORAL_TABLET | Freq: Every day | ORAL | 1 refills | Status: DC

## 2017-10-08 MED ORDER — ATORVASTATIN CALCIUM 40 MG PO TABS: 40.0000 mg | ORAL_TABLET | Freq: Every day | ORAL | 2 refills | Status: DC

## 2017-10-08 MED ORDER — DAPAGLIFLOZIN PRO-METFORMIN ER 5-1000 MG PO TB24: 5.0000 mg | ORAL_TABLET | Freq: Every day | ORAL | 1 refills | Status: DC

## 2017-10-08 NOTE — Patient Instructions (Signed)
Thank you for coming in today. Get labs in about 1 month.  Work on quitting smoking,  Recheck with me in April.  Return sooner if needed.

## 2017-10-08 NOTE — Progress Notes (Signed)
Shane Williams is a 46 y.o. male who [name] today for follow-up diabetes elevated hemoglobin, and testosterone.  Diabetes: Shane Williams takes Davonna BellingXigduo extended release 02/999 once daily.  He is not taking it twice daily.  He additionally takes Januvia daily.  He denies any hyperglycemia or hypoglycemic episodes.  He feels well with no chest pain palpitations or shortness of breath.  Hemoglobin: Shane Williams was found to have elevated hemoglobin after starting testosterone.  He has not been able to quit smoking nor has he addressed his sleep apnea.  He is donating blood.  Testosterone: Shane Williams feels well on testosterone.  He notes that it significantly increases his libido.  Hyperlipidemia: Shane Williams continues to take Lipitor daily.  He tolerates medication well with no significant muscle aches or pains.   Past Medical History:  Diagnosis Date  . Low testosterone 11/27/2012  . Type 2 diabetes mellitus (HCC) 11/27/2012   Past Surgical History:  Procedure Laterality Date  . APPENDECTOMY    . INCISE AND DRAIN ABCESS     4 times in last 2 years  . SHOULDER ARTHROSCOPY     Social History   Tobacco Use  . Smoking status: Current Every Day Smoker    Packs/day: 0.25    Last attempt to quit: 05/12/2017    Years since quitting: 0.4  . Smokeless tobacco: Never Used  Substance Use Topics  . Alcohol use: Yes   family history includes Diabetes in his father.  ROS as above:  Medications: Current Outpatient Medications  Medication Sig Dispense Refill  . AMBULATORY NON FORMULARY MEDICATION One Touch Ultra Blue Test Strips.  Diagnosis: Diabetes Mellitus. Test 2 times a day 100 strip 4  . atorvastatin (LIPITOR) 40 MG tablet Take 1 tablet (40 mg total) by mouth daily. 90 tablet 2  . Dapagliflozin-Metformin HCl ER (XIGDUO XR) 02-999 MG TB24 Take 5-1,000 mg by mouth 2 (two) times  daily. Evening dose can be cut to half a tablet if needed. 60 tablet 1  . ipratropium (ATROVENT) 0.06 % nasal spray Place 2 sprays into both nostrils 4 (four) times daily. 15 mL 1  . Lancets (ONETOUCH ULTRASOFT) lancets Use as directed 60 each 5  . sitaGLIPtin (JANUVIA) 100 MG tablet Take 1 tablet (100 mg total) by mouth daily. 90 tablet 0  . tadalafil (CIALIS) 20 MG tablet TAKE 1 TABLET BY MOUTH DAILY AS NEEDED FOR ERECTILE DYSFUNCTION 10 tablet 0  . testosterone cypionate (DEPOTESTOTERONE CYPIONATE) 100 MG/ML injection Inject 200 mg into the muscle every 14 (fourteen) days. For IM use only      No current facility-administered medications for this visit.    Allergies  Allergen Reactions  . Penicillins Swelling  . Viagra [Sildenafil Citrate]     headache    Health Maintenance Health Maintenance  Topic Date Due  . URINE MICROALBUMIN  08/06/2017  . INFLUENZA VACCINE  07/03/2018 (Originally 05/21/2017)  . HEMOGLOBIN A1C  01/15/2018  . OPHTHALMOLOGY EXAM  02/10/2018  . FOOT EXAM  07/03/2018  . PNEUMOCOCCAL POLYSACCHARIDE VACCINE (2) 08/06/2021  . TETANUS/TDAP  08/06/2026  . HIV Screening  Completed     Exam:  BP 123/78   Pulse 86   Wt 230 lb (104.3 kg)   BMI 31.19 kg/m  Gen: Well NAD HEENT: EOMI,  MMM Lungs: Normal work of breathing. CTABL Heart: RRR no MRG Abd: NABS, Soft. Nondistended, Nontender Exts: Brisk capillary refill, warm and well perfused.  Results for orders placed or performed in visit on 10/08/17 (from the past 72 hour(s))  POCT HgB A1C     Status: None   Collection Time: 10/08/17  7:19 AM  Result Value Ref Range   Hemoglobin A1C 7.0    No results found.    Assessment and Plan: 46 y.o. male with  Diabetes doing well continue current regimen recheck in 3 months.  Hemoglobin: We will recheck CBC and iron studies before the next visit.  Lipids doing well at last check.  Will check metabolic panel for liver tolerance.   Orders Placed This  Encounter  Procedures  . CBC  . Fe+TIBC+Fer  . COMPLETE METABOLIC PANEL WITH GFR  . POCT HgB A1C   No orders of the defined types were placed in this encounter.    Discussed warning signs or symptoms. Please see discharge instructions. Patient expresses understanding.

## 2017-10-22 ENCOUNTER — Ambulatory Visit: Payer: BLUE CROSS/BLUE SHIELD

## 2017-10-23 ENCOUNTER — Telehealth: Payer: Self-pay | Admitting: Family Medicine

## 2017-10-23 NOTE — Telephone Encounter (Signed)
[facility]

## 2017-10-23 NOTE — Telephone Encounter (Signed)
Pt's insurance is refusing to pay for lab (Hemochromatosis DNA-PCR) completed on 01/29/17. They are calling this lab "experimental."   To get an appeal, a letter of medical necessity has to be written by PCP. Information sent to insurance at [phone], attn: Optometristappeals coordinator [phone].

## 2017-10-24 ENCOUNTER — Encounter: Payer: Self-pay | Admitting: Family Medicine

## 2017-10-24 ENCOUNTER — Ambulatory Visit (INDEPENDENT_AMBULATORY_CARE_PROVIDER_SITE_OTHER): Payer: BLUE CROSS/BLUE SHIELD | Admitting: Family Medicine

## 2017-10-24 VITALS — BP 120/69 | HR 90

## 2017-10-24 DIAGNOSIS — E291 Testicular hypofunction: Secondary | ICD-10-CM

## 2017-10-24 MED ORDER — TESTOSTERONE CYPIONATE 200 MG/ML IM SOLN
200.0000 mg | Freq: Once | INTRAMUSCULAR | Status: AC
  Administered 2017-10-24: 200 mg via INTRAMUSCULAR

## 2017-10-24 NOTE — Telephone Encounter (Signed)
Letter faxed and Pt advised.

## 2017-10-24 NOTE — Progress Notes (Signed)
Patient came into office today for testosterone injection. Denies chest pain, shortness of breath, headaches and problems associated with taking this medication. Patient states he has had no abnornal mood swings. Patient tolerated injection in RUOQ well without complications. Patient advised to schedule his next injection for 2 weeks from today. 

## 2017-10-24 NOTE — Telephone Encounter (Signed)
Appeal letter written

## 2017-11-07 ENCOUNTER — Ambulatory Visit (INDEPENDENT_AMBULATORY_CARE_PROVIDER_SITE_OTHER): Payer: BLUE CROSS/BLUE SHIELD | Admitting: Family Medicine

## 2017-11-07 VITALS — BP 126/78 | HR 97

## 2017-11-07 DIAGNOSIS — E291 Testicular hypofunction: Secondary | ICD-10-CM | POA: Diagnosis not present

## 2017-11-07 MED ORDER — TESTOSTERONE CYPIONATE 200 MG/ML IM SOLN
200.0000 mg | Freq: Once | INTRAMUSCULAR | Status: AC
  Administered 2017-11-07: 200 mg via INTRAMUSCULAR

## 2017-11-07 NOTE — Progress Notes (Signed)
Patient is here for testosterone injection. Patient denies mood changes, blurred vision, elevated BP ect. Injection was administered without complication.

## 2017-11-21 ENCOUNTER — Ambulatory Visit (INDEPENDENT_AMBULATORY_CARE_PROVIDER_SITE_OTHER): Payer: BLUE CROSS/BLUE SHIELD | Admitting: Family Medicine

## 2017-11-21 ENCOUNTER — Other Ambulatory Visit: Payer: Self-pay | Admitting: Family Medicine

## 2017-11-21 VITALS — BP 114/71 | HR 82

## 2017-11-21 DIAGNOSIS — E291 Testicular hypofunction: Secondary | ICD-10-CM | POA: Diagnosis not present

## 2017-11-21 MED ORDER — TESTOSTERONE CYPIONATE 200 MG/ML IM SOLN
200.0000 mg | Freq: Once | INTRAMUSCULAR | Status: AC
  Administered 2017-11-21: 200 mg via INTRAMUSCULAR

## 2017-11-21 NOTE — Progress Notes (Signed)
Patient came into office today for testosterone injection. Denies chest pain, shortness of breath, headaches and problems associated with taking this medication. Patient states he has had no abnornal mood swings. Patient tolerated injection in RUOQ well without complications. Patient advised to schedule his next injection for 2 weeks from today. 

## 2017-12-04 ENCOUNTER — Ambulatory Visit (INDEPENDENT_AMBULATORY_CARE_PROVIDER_SITE_OTHER): Payer: BLUE CROSS/BLUE SHIELD | Admitting: Family Medicine

## 2017-12-04 VITALS — BP 128/72 | HR 91

## 2017-12-04 DIAGNOSIS — E291 Testicular hypofunction: Secondary | ICD-10-CM | POA: Diagnosis not present

## 2017-12-04 MED ORDER — TESTOSTERONE CYPIONATE 200 MG/ML IM SOLN
200.0000 mg | Freq: Once | INTRAMUSCULAR | Status: AC
  Administered 2017-12-04: 200 mg via INTRAMUSCULAR

## 2017-12-04 NOTE — Progress Notes (Signed)
Patient came into office today for testosterone injection. Denies chest pain, shortness of breath, headaches and problems associated with taking this medication. Patient states he has had no abnornal mood swings. Patient tolerated injection in LUOQ well without complications. Patient advised to schedule his next injection for 2 weeks from today. 

## 2017-12-19 ENCOUNTER — Ambulatory Visit: Payer: BLUE CROSS/BLUE SHIELD

## 2017-12-23 ENCOUNTER — Ambulatory Visit (INDEPENDENT_AMBULATORY_CARE_PROVIDER_SITE_OTHER): Payer: BLUE CROSS/BLUE SHIELD | Admitting: Family Medicine

## 2017-12-23 VITALS — BP 120/68 | HR 84 | Temp 98.1°F

## 2017-12-23 DIAGNOSIS — E291 Testicular hypofunction: Secondary | ICD-10-CM | POA: Diagnosis not present

## 2017-12-23 DIAGNOSIS — L719 Rosacea, unspecified: Secondary | ICD-10-CM | POA: Diagnosis not present

## 2017-12-23 MED ORDER — METRONIDAZOLE 1 % EX GEL: Freq: Two times a day (BID) | CUTANEOUS | 1 refills | Status: DC

## 2017-12-23 MED ORDER — TESTOSTERONE CYPIONATE 200 MG/ML IM SOLN
200.0000 mg | Freq: Once | INTRAMUSCULAR | Status: AC
Start: 2017-12-23 — End: 2017-12-23
  Administered 2017-12-23: 200 mg via INTRAMUSCULAR

## 2017-12-23 NOTE — Progress Notes (Signed)
Patient came into office today for testosterone injection. Denies chest pain, shortness of breath, headaches and problems associated with taking this medication. Patient states he has had no abnornal mood swings. Patient tolerated injection well in RUOQ without complications. Patient advised to schedule his next injection for 2 weeks from today. Pt also had a red rash covering both cheeks and forehead. PCP was able to come in for an evaluation. Rx was prescribed for Rosacea, we will monitor when he comes back in 2 weeks for next testosterone injection. No further questions.

## 2017-12-23 NOTE — Patient Instructions (Signed)
Thank you for coming in today. Apply the metrogel twice dialy.  Recheck nurse visit in 2 weeks.  Try using hypoallergenic soaps and detergents.    Rosacea Rosacea is a long-term (chronic) condition that affects the skin of the face, including the cheeks, nose, brow, and chin. This condition can also affect the eyes. Rosacea causes blood vessels near the surface of the skin to enlarge, which results in redness. What are the causes? The cause of this condition is not known. Certain triggers can make rosacea worse, including:  Hot baths.  Exercise.  Sunlight.  Very hot or cold temperatures.  Hot or spicy foods and drinks.  Drinking alcohol.  Stress.  Taking blood pressure medicine.  Long-term use of topical steroids on the face.  What increases the risk? This condition is more likely to develop in:  People who are older than 48 years of age.  Women.  People who have light-colored skin (light complexion).  People who have a family history of rosacea.  What are the signs or symptoms? Symptoms of this condition include:  Redness of the face.  Red bumps or pimples on the face.  A red, enlarged nose.  Blushing easily.  Red lines on the skin.  Irritated or burning feeling in the eyes.  Swollen eyelids.  How is this diagnosed? This condition is diagnosed with a medical history and physical exam. How is this treated? There is no cure for this condition, but treatment can help to control your symptoms. Your health care provider may recommend that you see a skin specialist (dermatologist). Treatment may include:  Antibiotic medicines that are applied to the skin or taken as a pill.  Laser treatment to improve the appearance of the skin.  Surgery. This is rare.  Your health care provider will also recommend the best way to take care of your skin. Even after your skin improves, you will likely need to continue treatment to prevent your rosacea from coming  back. Follow these instructions at home: Skin Care Take care of your skin as told by your health care provider. You may be told to do these things:  Wash your skin gently two or more times each day.  Use mild soap.  Use a sunscreen or sunblock with SPF 30 or greater.  Use gentle cosmetics that are meant for sensitive skin.  Shave with an electric shaver instead of a blade.  Lifestyle  Try to keep track of what foods trigger this condition. Avoid any triggers. These may include: ? Spicy foods. ? Seafood. ? Cheese. ? Hot liquids. ? Nuts. ? Chocolate. ? Iodized salt.  Do not drink alcohol.  Avoid extremely cold or hot temperatures.  Try to reduce your stress. If you need help, talk with your health care provider.  When you exercise, do these things to stay cool: ? Limit your sun exposure. ? Use a fan. ? Do shorter and more frequent intervals of exercise. General instructions  Keep all follow-up visits as told by your health care provider. This is important.  Take over-the-counter and prescription medicines only as told by your health care provider.  If your eyelids are affected, apply warm compresses to them. Do this as told by your health care provider.  If you were prescribed an antibiotic medicine, apply or take it as told by your health care provider. Do not stop using the antibiotic even if your condition improves. Contact a health care provider if:  Your symptoms get worse.  Your symptoms do  not improve after two months of treatment.  You have new symptoms.  You have any changes in vision or you have problems with your eyes, such as redness or itching.  You feel depressed.  You lose your appetite.  You have trouble concentrating. This information is not intended to replace advice given to you by your health care provider. Make sure you discuss any questions you have with your health care provider. Document Released: 11/14/2004 Document Revised:  03/14/2016 Document Reviewed: 12/14/2014 Elsevier Interactive Patient Education  [employer].

## 2017-12-23 NOTE — Progress Notes (Signed)
Shane CocaJoseph Williams is a 47 y.o. male who [name] today for rash.  Is noted in the nursing note attached [name] has a 1 month history of rash on his face and head.  He cannot think of any exacerbating factors.  He denies any new medications.  He does note a new shower soap but no other cosmetics or detergents or new.  He denies any pain or tenderness with the rash and notes that it is irritated mildly.  He is tried some Eucerin cream which has not helped.  No fevers or chills.   Past Medical History:  Diagnosis Date  . Low testosterone 11/27/2012  . Type 2 diabetes mellitus (HCC) 11/27/2012   Past Surgical History:  Procedure Laterality Date  . APPENDECTOMY    . INCISE AND DRAIN ABCESS     4 times in last 2 years  . SHOULDER ARTHROSCOPY     Social History   Tobacco Use  . Smoking status: Current Every Day Smoker    Packs/day: 0.25    Last attempt to quit: 05/12/2017    Years since quitting: 0.6  . Smokeless tobacco: Never Used  Substance Use Topics  . Alcohol use: Yes   family history includes Diabetes in his father.  ROS as above:  Medications: Current Outpatient Medications  Medication Sig Dispense Refill  . AMBULATORY NON FORMULARY MEDICATION One Touch Ultra Blue Test Strips.  Diagnosis: Diabetes Mellitus. Test 2 times a day 100 strip 4  . atorvastatin (LIPITOR) 40 MG tablet Take 1 tablet (40 mg total) by mouth daily. 90 tablet 2  . Dapagliflozin-Metformin HCl ER (XIGDUO XR) 02-999 MG TB24 Take 5-1,000 mg by mouth daily. Evening dose can be cut to half a tablet if needed. 90 tablet 1  . ipratropium (ATROVENT) 0.06 % nasal spray Place 2 sprays into both nostrils 4 (four) times daily. 15 mL 1  . Lancets (ONETOUCH ULTRASOFT) lancets Use as directed 60 each 5  . sitaGLIPtin (JANUVIA) 100 MG tablet Take 1 tablet (100 mg total) by mouth daily. 90 tablet 1   . tadalafil (CIALIS) 20 MG tablet TAKE 1 TABLET BY MOUTH DAILY AS NEEDED FOR ERECTILE DYSFUNCTION 10 tablet 0  . testosterone cypionate (DEPOTESTOTERONE CYPIONATE) 100 MG/ML injection Inject 200 mg into the muscle every 14 (fourteen) days. For IM use only     . metroNIDAZOLE (METROGEL) 1 % gel Apply topically 2 (two) times daily. 60 g 1   No current facility-administered medications for this visit.    Allergies  Allergen Reactions  . Penicillins Swelling  . Viagra [Sildenafil Citrate]     headache    Health Maintenance Health Maintenance  Topic Date Due  . URINE MICROALBUMIN  08/06/2017  . INFLUENZA VACCINE  07/03/2018 (Originally 05/21/2017)  . OPHTHALMOLOGY EXAM  02/10/2018  . HEMOGLOBIN A1C  04/08/2018  . FOOT EXAM  07/03/2018  . PNEUMOCOCCAL POLYSACCHARIDE VACCINE (2) 08/06/2021  . TETANUS/TDAP  08/06/2026  . HIV Screening  Completed     Exam:  BP 120/68   Pulse 84   Temp 98.1 F (36.7 C) (Oral)  Gen: Well NAD HEENT: EOMI,  MMM Lungs: Normal work of breathing. CTABL Heart: RRR no MRG Abd: NABS, Soft. Nondistended, Nontender Exts: Brisk capillary refill, warm and well perfused.  Skin: Erythematous maculopapular rash on cheeks across the bridge of the nose and along the forehead.  Nontender.  No results found for this or any previous  visit (from the past 72 hour(s)). No results found.    Assessment and Plan: 47 y.o. male with rash is very likely rosacea.  Treatment with metronidazole gel.  Follow-up in a few weeks with next nurse visit.   No orders of the defined types were placed in this encounter.  Meds ordered this encounter  Medications  . metroNIDAZOLE (METROGEL) 1 % gel    Sig: Apply topically 2 (two) times daily.    Dispense:  60 g    Refill:  1  . testosterone cypionate (DEPOTESTOSTERONE CYPIONATE) injection 200 mg     Discussed warning signs or symptoms. Please see discharge instructions. Patient expresses understanding.

## 2018-01-06 ENCOUNTER — Ambulatory Visit (INDEPENDENT_AMBULATORY_CARE_PROVIDER_SITE_OTHER): Payer: BLUE CROSS/BLUE SHIELD | Admitting: Family Medicine

## 2018-01-06 VITALS — BP 128/71 | HR 82 | Temp 97.9°F | Resp 16 | Wt 232.8 lb

## 2018-01-06 DIAGNOSIS — R7989 Other specified abnormal findings of blood chemistry: Secondary | ICD-10-CM | POA: Diagnosis not present

## 2018-01-06 DIAGNOSIS — E291 Testicular hypofunction: Secondary | ICD-10-CM

## 2018-01-06 MED ORDER — TESTOSTERONE CYPIONATE 200 MG/ML IM SOLN
200.0000 mg | Freq: Once | INTRAMUSCULAR | Status: AC
  Administered 2018-01-06: 200 mg via INTRAMUSCULAR

## 2018-01-06 NOTE — Progress Notes (Signed)
HPI: Patient is here for a testosterone injection. Denies chest pain, shortness of breath, headaches, medication problems and mood changes.     Assessment and Plan: Patient tolerated injection - LUOQ - well without complications. Patient advised to schedule next injection in 14 days.

## 2018-01-20 ENCOUNTER — Ambulatory Visit (INDEPENDENT_AMBULATORY_CARE_PROVIDER_SITE_OTHER): Payer: BLUE CROSS/BLUE SHIELD | Admitting: Family Medicine

## 2018-01-20 VITALS — BP 126/75 | HR 87

## 2018-01-20 DIAGNOSIS — E291 Testicular hypofunction: Secondary | ICD-10-CM

## 2018-01-20 MED ORDER — TESTOSTERONE CYPIONATE 200 MG/ML IM SOLN
200.0000 mg | Freq: Once | INTRAMUSCULAR | Status: AC
  Administered 2018-01-20: 200 mg via INTRAMUSCULAR

## 2018-01-20 NOTE — Progress Notes (Signed)
Patient came into office today for testosterone injection. Denies chest pain, shortness of breath, headaches and problems associated with taking this medication. Patient states he has had no abnornal mood swings. Patient tolerated injection in LUOQ well without complications. Patient advised to schedule his next injection for 2 weeks from today. Pt also has been seen the last 2 visits for a rash on his face. He reports continued use of his metrogel, the rash is better. Pt reports no other changes.

## 2018-01-28 ENCOUNTER — Ambulatory Visit: Payer: BLUE CROSS/BLUE SHIELD | Admitting: Family Medicine

## 2018-02-05 ENCOUNTER — Ambulatory Visit (INDEPENDENT_AMBULATORY_CARE_PROVIDER_SITE_OTHER): Payer: BLUE CROSS/BLUE SHIELD | Admitting: Family Medicine

## 2018-02-05 ENCOUNTER — Encounter: Payer: Self-pay | Admitting: Family Medicine

## 2018-02-05 ENCOUNTER — Ambulatory Visit: Payer: BLUE CROSS/BLUE SHIELD

## 2018-02-05 VITALS — BP 124/78 | HR 87 | Temp 97.3°F | Ht 70.0 in | Wt 232.0 lb

## 2018-02-05 DIAGNOSIS — E6609 Other obesity due to excess calories: Secondary | ICD-10-CM | POA: Diagnosis not present

## 2018-02-05 DIAGNOSIS — E785 Hyperlipidemia, unspecified: Secondary | ICD-10-CM

## 2018-02-05 DIAGNOSIS — R7989 Other specified abnormal findings of blood chemistry: Secondary | ICD-10-CM

## 2018-02-05 DIAGNOSIS — D582 Other hemoglobinopathies: Secondary | ICD-10-CM

## 2018-02-05 DIAGNOSIS — E119 Type 2 diabetes mellitus without complications: Secondary | ICD-10-CM | POA: Diagnosis not present

## 2018-02-05 DIAGNOSIS — L719 Rosacea, unspecified: Secondary | ICD-10-CM | POA: Diagnosis not present

## 2018-02-05 DIAGNOSIS — Z6833 Body mass index (BMI) 33.0-33.9, adult: Secondary | ICD-10-CM | POA: Diagnosis not present

## 2018-02-05 LAB — POCT UA - MICROALBUMIN
Creatinine, POC: 200 mg/dL
MICROALBUMIN (UR) POC: 10 mg/L

## 2018-02-05 MED ORDER — TESTOSTERONE CYPIONATE 200 MG/ML IM SOLN
200.0000 mg | Freq: Once | INTRAMUSCULAR | Status: AC
Start: 2018-02-05 — End: 2018-02-05
  Administered 2018-02-05: 200 mg via INTRAMUSCULAR

## 2018-02-05 NOTE — Progress Notes (Signed)
poc

## 2018-02-05 NOTE — Progress Notes (Signed)
Shane Williams is a 47 y.o. male who [name] today for follow-up diabetes, hypo-testosterone, elevated hemoglobin, , hyperlipidemia.  Diabetes: [name] takes medications listed below and feels pretty well with no low or high blood sugar episodes.  He tolerates medications.  No fevers chills nausea vomiting or diarrhea.  Low testosterone: Managed with weekly testosterone injections.  Tolerating it well and notes improved libido.  He uses Cialis for erectile dysfunction which works well also.  Elevated hemoglobin: Patient has had significantly elevated hemoglobin levels in the past.  He denies any snoring or other sleep apnea risk factors listed below.  Additionally he smokes.  He smokes approximately three quarters of a pack of cigarettes per day and is not interested in quitting but is somewhat interested in cutting back.  He had an evaluation previously and found not to have hemochromatosis.  Lipids: Tolerating atorvastatin well without significant muscle aches or pains.  Rosacea: Patient has rosacea which is partially well managed with metronidazole gel.  He notes that help but does not resolved his facial rash.   Past Medical History:  Diagnosis Date  . Low testosterone 11/27/2012  . Type 2 diabetes mellitus (HCC) 11/27/2012   Past Surgical History:  Procedure Laterality Date  . APPENDECTOMY    . INCISE AND DRAIN ABCESS     4 times in last 2 years  . SHOULDER ARTHROSCOPY     Social History   Tobacco Use  . Smoking status: Current Every Day Smoker    Packs/day: 0.25    Last attempt to quit: 05/12/2017    Years since quitting: 0.7  . Smokeless tobacco: Never Used  Substance Use Topics  . Alcohol use: Yes   family history includes Diabetes in his father.  ROS as above:  Medications: Current Outpatient Medications  Medication Sig Dispense Refill   . AMBULATORY NON FORMULARY MEDICATION One Touch Ultra Blue Test Strips.  Diagnosis: Diabetes Mellitus. Test 2 times a day 100 strip 4  . atorvastatin (LIPITOR) 40 MG tablet Take 1 tablet (40 mg total) by mouth daily. 90 tablet 2  . Dapagliflozin-Metformin HCl ER (XIGDUO XR) 02-999 MG TB24 Take 5-1,000 mg by mouth daily. Evening dose can be cut to half a tablet if needed. 90 tablet 1  . Lancets (ONETOUCH ULTRASOFT) lancets Use as directed 60 each 5  . metroNIDAZOLE (METROGEL) 1 % gel Apply topically 2 (two) times daily. 60 g 1  . sitaGLIPtin (JANUVIA) 100 MG tablet Take 1 tablet (100 mg total) by mouth daily. 90 tablet 1  . tadalafil (CIALIS) 20 MG tablet TAKE 1 TABLET BY MOUTH DAILY AS NEEDED FOR ERECTILE DYSFUNCTION 10 tablet 0  . testosterone cypionate (DEPOTESTOTERONE CYPIONATE) 100 MG/ML injection Inject 200 mg into the muscle every 14 (fourteen) days. For IM use only      No current facility-administered medications for this visit.    Allergies  Allergen Reactions  . Penicillins Swelling  . Viagra [Sildenafil Citrate]     headache    Health Maintenance Health Maintenance  Topic Date Due  . INFLUENZA VACCINE  07/03/2018 (Originally 05/21/2018)  . OPHTHALMOLOGY EXAM  02/10/2018  . HEMOGLOBIN A1C  04/08/2018  . FOOT EXAM  07/03/2018  . URINE MICROALBUMIN  02/06/2019  . PNEUMOCOCCAL POLYSACCHARIDE VACCINE (2) 08/06/2021  . TETANUS/TDAP  08/06/2026  . HIV Screening  Completed     Exam:  BP 124/78   Pulse 87   Temp (!)  97.3 F (36.3 C) (Oral)   Ht 5\' 10"  (1.778 m)   Wt 232 lb (105.2 kg)   BMI 33.29 kg/m  Gen: Well NAD HEENT: EOMI,  MMM Lungs: Normal work of breathing. CTABL Heart: RRR no MRG Abd: NABS, Soft. Nondistended, Nontender Exts: Brisk capillary refill, warm and well perfused.  Skin: Erythematous maculopapular rash malar pattern face consistent with rosacea  STOP BANG: Snore:     No Tired:     No Observed stop breathing:  No Hypertension:   Yes  BMI  >35:   No Age >50:   No Neck > 16 inches:  Yes Male gender:   Yes ------------------------------------------ Total:     3/8      Assessment and Plan: 47 y.o. male with  Diabetes: Check hemoglobin A1c with labs next week.  Continue current regimen.  Work on reduced calorie reduced carb diet.  Check point-of-care microalbumin  Hyperlipidemia: Recheck lipids continue current regimen.  Testosterone: Check PSA CBC testosterone.  Anticipate continue current medication  Elevated hemoglobin: Recheck today.  Recommend quitting smoking as this will help.  Rosacea: Continue current regimen.  Recheck 3 months.  Orders Placed This Encounter  Procedures  . CBC  . COMPLETE METABOLIC PANEL WITH GFR  . Lipid Panel w/reflex Direct LDL  . PSA  . Testosterone  . Hemoglobin A1c  . POCT UA - Microalbumin   Meds ordered this encounter  Medications  . testosterone cypionate (DEPOTESTOSTERONE CYPIONATE) injection 200 mg     Discussed warning signs or symptoms. Please see discharge instructions. Patient expresses understanding.

## 2018-02-05 NOTE — Patient Instructions (Addendum)
Thank you for coming in today. Get fasting labs next week.  Recheck every 6 months.  Return sooner if needed.   Continue medicine.

## 2018-02-17 DIAGNOSIS — E785 Hyperlipidemia, unspecified: Secondary | ICD-10-CM | POA: Diagnosis not present

## 2018-02-17 DIAGNOSIS — E6609 Other obesity due to excess calories: Secondary | ICD-10-CM | POA: Diagnosis not present

## 2018-02-17 DIAGNOSIS — E119 Type 2 diabetes mellitus without complications: Secondary | ICD-10-CM | POA: Diagnosis not present

## 2018-02-17 DIAGNOSIS — R7989 Other specified abnormal findings of blood chemistry: Secondary | ICD-10-CM | POA: Diagnosis not present

## 2018-02-17 LAB — COMPLETE METABOLIC PANEL WITH GFR
AG Ratio: 1.7 (calc) (ref 1.0–2.5)
ALBUMIN MSPROF: 4.2 g/dL (ref 3.6–5.1)
ALT: 38 U/L (ref 9–46)
AST: 24 U/L (ref 10–40)
Alkaline phosphatase (APISO): 64 U/L (ref 40–115)
BILIRUBIN TOTAL: 1.3 mg/dL — AB (ref 0.2–1.2)
BUN: 10 mg/dL (ref 7–25)
CALCIUM: 9.2 mg/dL (ref 8.6–10.3)
CHLORIDE: 102 mmol/L (ref 98–110)
CO2: 24 mmol/L (ref 20–32)
CREATININE: 1.01 mg/dL (ref 0.60–1.35)
GFR, EST AFRICAN AMERICAN: 101 mL/min/{1.73_m2} (ref >=60)
GFR, EST NON AFRICAN AMERICAN: 88 mL/min/{1.73_m2} (ref >=60)
Globulin: 2.5 g/dL (calc) (ref 1.9–3.7)
Glucose, Bld: 157 mg/dL — ABNORMAL HIGH (ref 65–99)
Potassium: 4.1 mmol/L (ref 3.5–5.3)
Sodium: 137 mmol/L (ref 135–146)
TOTAL PROTEIN: 6.7 g/dL (ref 6.1–8.1)

## 2018-02-17 LAB — LIPID PANEL W/REFLEX DIRECT LDL
CHOL/HDL RATIO: 3.9 (calc) (ref <5.0)
Cholesterol: 106 mg/dL (ref <200)
HDL: 27 mg/dL — ABNORMAL LOW (ref >40)
LDL CHOLESTEROL (CALC): 57 mg/dL
NON-HDL CHOLESTEROL (CALC): 79 mg/dL (ref <130)
TRIGLYCERIDES: 137 mg/dL (ref <150)

## 2018-02-17 LAB — PSA: PSA: 0.4 ng/mL (ref <=4.0)

## 2018-02-17 LAB — CBC
HCT: 53.5 % — ABNORMAL HIGH (ref 38.5–50.0)
HEMOGLOBIN: 18.2 g/dL — AB (ref 13.2–17.1)
MCH: 28.9 pg (ref 27.0–33.0)
MCHC: 34 g/dL (ref 32.0–36.0)
MCV: 84.9 fL (ref 80.0–100.0)
MPV: 9.7 fL (ref 7.5–12.5)
Platelets: 202 10*3/uL (ref 140–400)
RBC: 6.3 10*6/uL — AB (ref 4.20–5.80)
RDW: 13.7 % (ref 11.0–15.0)
WBC: 7.3 10*3/uL (ref 3.8–10.8)

## 2018-02-18 LAB — TESTOSTERONE: Testosterone: 275 ng/dL (ref 250–827)

## 2018-02-18 LAB — HEMOGLOBIN A1C
EAG (MMOL/L): 11.9 (calc)
HEMOGLOBIN A1C: 9.1 %{Hb} — AB (ref <5.7)
MEAN PLASMA GLUCOSE: 214 (calc)

## 2018-02-19 ENCOUNTER — Ambulatory Visit (INDEPENDENT_AMBULATORY_CARE_PROVIDER_SITE_OTHER): Payer: BLUE CROSS/BLUE SHIELD | Admitting: Sports Medicine

## 2018-02-19 VITALS — BP 116/61 | HR 77 | Wt 232.0 lb

## 2018-02-19 DIAGNOSIS — E291 Testicular hypofunction: Secondary | ICD-10-CM | POA: Diagnosis not present

## 2018-02-19 MED ORDER — TESTOSTERONE CYPIONATE 200 MG/ML IM SOLN
200.0000 mg | Freq: Once | INTRAMUSCULAR | Status: AC
  Administered 2018-02-19: 200 mg via INTRAMUSCULAR

## 2018-02-19 NOTE — Progress Notes (Signed)
Patient came into office today for testosterone injection. Denies chest pain, shortness of breath, headaches and problems associated with taking this medication. Patient states he has had no abnornal mood swings. Patient tolerated injection in LUOQ well without complications. Patient advised to schedule his next injection for 2 weeks from today. 

## 2018-03-05 ENCOUNTER — Ambulatory Visit (INDEPENDENT_AMBULATORY_CARE_PROVIDER_SITE_OTHER): Payer: BLUE CROSS/BLUE SHIELD | Admitting: Family Medicine

## 2018-03-05 ENCOUNTER — Encounter: Payer: Self-pay | Admitting: Family Medicine

## 2018-03-05 VITALS — BP 104/72 | HR 93 | Wt 229.0 lb

## 2018-03-05 DIAGNOSIS — Z9189 Other specified personal risk factors, not elsewhere classified: Secondary | ICD-10-CM

## 2018-03-05 DIAGNOSIS — E291 Testicular hypofunction: Secondary | ICD-10-CM | POA: Diagnosis not present

## 2018-03-05 DIAGNOSIS — E119 Type 2 diabetes mellitus without complications: Secondary | ICD-10-CM

## 2018-03-05 DIAGNOSIS — E785 Hyperlipidemia, unspecified: Secondary | ICD-10-CM | POA: Diagnosis not present

## 2018-03-05 MED ORDER — ASPIRIN EC 81 MG PO TBEC: 81.0000 mg | DELAYED_RELEASE_TABLET | Freq: Every day | ORAL | 3 refills | Status: AC

## 2018-03-05 MED ORDER — DAPAGLIFLOZIN PRO-METFORMIN ER 5-1000 MG PO TB24: 5.0000 mg | ORAL_TABLET | Freq: Two times a day (BID) | ORAL | 1 refills | Status: DC

## 2018-03-05 MED ORDER — TESTOSTERONE CYPIONATE 200 MG/ML IM SOLN
200.0000 mg | INTRAMUSCULAR | Status: DC
Start: 2018-03-05 — End: 2018-04-02
  Administered 2018-03-05: 200 mg via INTRAMUSCULAR

## 2018-03-05 MED ORDER — DULAGLUTIDE 0.75 MG/0.5ML ~~LOC~~ SOAJ: 0.5000 mL | SUBCUTANEOUS | 11 refills | Status: DC

## 2018-03-05 NOTE — Patient Instructions (Signed)
Thank you for coming in today. Increase the Xigduo to twice daily.  STOP januvia.  Start Trulicity (or equilivent if [employer] wants a different brand).  Recheck with me in 3 months.  Send me a blood sugar update in about 6 weeks.   Work on reduced carbs.  Try to keep the total carb per meal at under 60 grams.   Dulaglutide injection What is this medicine? DULAGLUTIDE (DOO la GLOO tide) is used to improve blood sugar control in adults with type 2 diabetes. This medicine may be used with other oral diabetes medicines. This medicine may be used for other purposes; ask your health care provider or pharmacist if you have questions. COMMON BRAND NAME(S): TRULICITY What should I tell my health care provider before I take this medicine? They need to know if you have any of these conditions: -endocrine tumors (MEN 2) or if someone in your family had these tumors -history of pancreatitis -kidney disease -liver disease -stomach problems -thyroid cancer or if someone in your family had thyroid cancer -an unusual or allergic reaction to dulaglutide, other medicines, foods, dyes, or preservatives -pregnant or trying to get pregnant -breast-feeding How should I use this medicine? This medicine is for injection under the skin of your upper leg (thigh), stomach area, or upper arm. It is usually given once every week (every 7 days). You will be taught how to prepare and give this medicine. Use exactly as directed. Take your medicine at regular intervals. Do not take it more often than directed. If you use this medicine with insulin, you should inject this medicine and the insulin separately. Do not mix them together. Do not give the injections right next to each other. Change (rotate) injection sites with each injection. It is important that you put your used needles and syringes in a special sharps container. Do not put them in a trash can. If you do not have a sharps container, call your  pharmacist or healthcare provider to get one. A special MedGuide will be given to you by the pharmacist with each prescription and refill. Be sure to read this information carefully each time. Talk to your pediatrician regarding the use of this medicine in children. Special care may be needed. Overdosage: If you think you have taken too much of this medicine contact a poison control center or emergency room at once. NOTE: This medicine is only for you. Do not share this medicine with others. What if I miss a dose? If you miss a dose, take it as soon as you can within 3 days after the missed dose. Then take your next dose at your regular weekly time. If it has been longer than 3 days after the missed dose, do not take the missed dose. Take the next dose at your regular time. Do not take double or extra doses. If you have questions about a missed dose, contact your health care provider for advice. What may interact with this medicine? -other medicines for diabetes Many medications may cause changes in blood sugar, these include: -alcohol containing beverages -antiviral medicines for HIV or AIDS -aspirin and aspirin-like drugs -certain medicines for blood pressure, heart disease, irregular heart beat -chromium -diuretics -male hormones, such as estrogens or progestins, birth control pills -fenofibrate -gemfibrozil -isoniazid -lanreotide -male hormones or anabolic steroids -MAOIs like Carbex, Eldepryl, Marplan, Nardil, and Parnate -medicines for weight loss -medicines for allergies, asthma, cold, or cough -medicines for depression, anxiety, or psychotic disturbances -niacin -nicotine -NSAIDs, medicines for pain  and inflammation, like ibuprofen or naproxen -octreotide -pasireotide -pentamidine -phenytoin -probenecid -quinolone antibiotics such as ciprofloxacin, levofloxacin, ofloxacin -some herbal dietary supplements -steroid medicines such as prednisone or  cortisone -sulfamethoxazole; trimethoprim -thyroid hormones Some medications can hide the warning symptoms of low blood sugar (hypoglycemia). You may need to monitor your blood sugar more closely if you are taking one of these medications. These include: -beta-blockers, often used for high blood pressure or heart problems (examples include atenolol, metoprolol, propranolol) -clonidine -guanethidine -reserpine This list may not describe all possible interactions. Give your health care provider a list of all the medicines, herbs, non-prescription drugs, or dietary supplements you use. Also tell them if you smoke, drink alcohol, or use illegal drugs. Some items may interact with your medicine. What should I watch for while using this medicine? Visit your doctor or health care professional for regular checks on your progress. Drink plenty of fluids while taking this medicine. Check with your doctor or health care professional if you get an attack of severe diarrhea, nausea, and vomiting. The loss of too much body fluid can make it dangerous for you to take this medicine. A test called the HbA1C (A1C) will be monitored. This is a simple blood test. It measures your blood sugar control over the last 2 to 3 months. You will receive this test every 3 to 6 months. Learn how to check your blood sugar. Learn the symptoms of low and high blood sugar and how to manage them. Always carry a quick-source of sugar with you in case you have symptoms of low blood sugar. Examples include hard sugar candy or glucose tablets. Make sure others know that you can choke if you eat or drink when you develop serious symptoms of low blood sugar, such as seizures or unconsciousness. They must get medical help at once. Tell your doctor or health care professional if you have high blood sugar. You might need to change the dose of your medicine. If you are sick or exercising more than usual, you might need to change the dose of your  medicine. Do not skip meals. Ask your doctor or health care professional if you should avoid alcohol. Many nonprescription cough and cold products contain sugar or alcohol. These can affect blood sugar. Pens should never be shared. Even if the needle is changed, sharing may result in passing of viruses like hepatitis or HIV. Wear a medical ID bracelet or chain, and carry a card that describes your disease and details of your medicine and dosage times. What side effects may I notice from receiving this medicine? Side effects that you should report to your doctor or health care professional as soon as possible: -allergic reactions like skin rash, itching or hives, swelling of the face, lips, or tongue -breathing problems -diarrhea that continues or is severe -lump or swelling on the neck -severe nausea -signs and symptoms of infection like fever or chills; cough; sore throat; pain or trouble passing urine -signs and symptoms of low blood sugar such as feeling anxious, confusion, dizziness, increased hunger, unusually weak or tired, sweating, shakiness, cold, irritable, headache, blurred vision, fast heartbeat, loss of consciousness -signs and symptoms of kidney injury like trouble passing urine or change in the amount of urine -trouble swallowing -unusual stomach upset or pain -vomiting Side effects that usually do not require medical attention (report to your doctor or health care professional if they continue or are bothersome): -diarrhea -loss of appetite -nausea -pain, redness, or irritation at site where injected -  stomach upset This list may not describe all possible side effects. Call your doctor for medical advice about side effects. You may report side effects to FDA at 1-800-FDA-1088. Where should I keep my medicine? Keep out of the reach of children. Store unopened pens in a refrigerator between 2 and 8 degrees C (36 and 46 degrees F). Do not freeze or use if the medicine has been  frozen. Protect from light and excessive heat. Store in the carton until use. Each single-dose pen can be kept at room temperature, not to exceed 30 degrees C (86 degrees F) for a total of 14 days, if needed. Throw away any unused medicine after the expiration date on the label. NOTE: This sheet is a summary. It may not cover all possible information. If you have questions about this medicine, talk to your doctor, pharmacist, or health care provider.  2018 Elsevier/Gold Standard (2016-10-24 14:35:01)

## 2018-03-05 NOTE — Progress Notes (Signed)
Shane Williams is a 47 y.o. male who presents to [name] today for diabetes.  Shane Williams was seen a few weeks ago and on lab recheck his A1c had increased to 9.1.  He currently is taking Januvia 100 mg daily and Xigduo XR 02/999 mg once daily.  He notes his average fasting blood sugar is anywhere from 130-190.  His postprandial blood sugars are typically in the 190s or so.  He denies any hypoglycemic episodes or polyuria polydipsia.  He feels quite well.  No fevers or chills.  Lipids: Cholesterol rechecked in the interval.  He continues to take atorvastatin 40 mg daily with no significant muscle aches or pains.     Past Medical History:  Diagnosis Date  . Low testosterone 11/27/2012  . Type 2 diabetes mellitus (HCC) 11/27/2012   Past Surgical History:  Procedure Laterality Date  . APPENDECTOMY    . INCISE AND DRAIN ABCESS     4 times in last 2 years  . SHOULDER ARTHROSCOPY     Social History   Tobacco Use  . Smoking status: Current Every Day Smoker    Packs/day: 0.25    Last attempt to quit: 05/12/2017    Years since quitting: 0.8  . Smokeless tobacco: Never Used  Substance Use Topics  . Alcohol use: Yes   family history includes Diabetes in his father.  ROS as above:  Medications: Current Outpatient Medications  Medication Sig Dispense Refill  . AMBULATORY NON FORMULARY MEDICATION One Touch Ultra Blue Test Strips.  Diagnosis: Diabetes Mellitus. Test 2 times a day 100 strip 4  . atorvastatin (LIPITOR) 40 MG tablet Take 1 tablet (40 mg total) by mouth daily. 90 tablet 2  . Dapagliflozin-metFORMIN HCl ER (XIGDUO XR) 02-999 MG TB24 Take 5-1,000 mg by mouth 2 (two) times daily. Evening dose can be cut to half a tablet if needed. 180 tablet 1  . Lancets (ONETOUCH ULTRASOFT) lancets Use as directed 60 each 5  . metroNIDAZOLE (METROGEL) 1 % gel Apply topically 2  (two) times daily. 60 g 1  . tadalafil (CIALIS) 20 MG tablet TAKE 1 TABLET BY MOUTH DAILY AS NEEDED FOR ERECTILE DYSFUNCTION 10 tablet 0  . testosterone cypionate (DEPOTESTOTERONE CYPIONATE) 100 MG/ML injection Inject 200 mg into the muscle every 14 (fourteen) days. For IM use only     . aspirin EC 81 MG tablet Take 1 tablet (81 mg total) by mouth daily. 90 tablet 3  . Dulaglutide (TRULICITY) 0.75 MG/0.5ML SOPN Inject 0.5 mLs into the skin once a week. 4 pen 11   Current Facility-Administered Medications  Medication Dose Route Frequency Provider Last Rate Last Dose  . testosterone cypionate (DEPOTESTOSTERONE CYPIONATE) injection 200 mg  200 mg Intramuscular Q14 Days Rodolph Bong, MD   200 mg at 03/05/18 8295   Allergies  Allergen Reactions  . Penicillins Swelling  . Viagra [Sildenafil Citrate]     headache    Health Maintenance Health Maintenance  Topic Date Due  . OPHTHALMOLOGY EXAM  02/10/2018  . INFLUENZA VACCINE  07/03/2018 (Originally 05/21/2018)  . FOOT EXAM  07/03/2018  . HEMOGLOBIN A1C  08/19/2018  . URINE MICROALBUMIN  02/06/2019  . PNEUMOCOCCAL POLYSACCHARIDE VACCINE (2) 08/06/2021  . TETANUS/TDAP  08/06/2026  . HIV Screening  Completed     Exam:  BP 104/72   Pulse 93   Wt 229 lb (103.9 kg)   BMI 32.86 kg/m  Gen: Well NAD HEENT:  EOMI,  MMM Lungs: Normal work of breathing. CTABL Heart: RRR no MRG Abd: NABS, Soft. Nondistended, Nontender Exts: Brisk capillary refill, warm and well perfused.   Lab Results  Component Value Date   HGBA1C 9.1 (H) 02/17/2018   Lab Results  Component Value Date   CHOL 106 02/17/2018   HDL 27 (L) 02/17/2018   LDLCALC 57 02/17/2018   TRIG 137 02/17/2018   CHOLHDL 3.9 02/17/2018        Assessment and Plan: 47 y.o. male with  Diabetes: Not well controlled.  Plan to increase Xigduo XR to twice daily.  Additionally will stop Januvia and start Trulicity.  Work on reduced carbohydrate diet.  Plan for fewer than 60 g of carbs  per meal if possible.  Lipids: LDL 57 at last check.  This is excellent.  Patient tolerates atorvastatin well.  Continue atorvastatin.  After discussion is reasonable to use 81 mg of aspirin daily as well.  Continue testosterone injections per protocol.  Hemoglobin acceptable at last check.   No orders of the defined types were placed in this encounter.  Meds ordered this encounter  Medications  . testosterone cypionate (DEPOTESTOSTERONE CYPIONATE) injection 200 mg  . Dapagliflozin-metFORMIN HCl ER (XIGDUO XR) 02-999 MG TB24    Sig: Take 5-1,000 mg by mouth 2 (two) times daily. Evening dose can be cut to half a tablet if needed.    Dispense:  180 tablet    Refill:  1  . aspirin EC 81 MG tablet    Sig: Take 1 tablet (81 mg total) by mouth daily.    Dispense:  90 tablet    Refill:  3  . Dulaglutide (TRULICITY) 0.75 MG/0.5ML SOPN    Sig: Inject 0.5 mLs into the skin once a week.    Dispense:  4 pen    Refill:  11    Patient will have discount coupon. Replaces Januvia     Discussed warning signs or symptoms. Please see discharge instructions. Patient expresses understanding.

## 2018-03-19 ENCOUNTER — Ambulatory Visit (INDEPENDENT_AMBULATORY_CARE_PROVIDER_SITE_OTHER): Payer: BLUE CROSS/BLUE SHIELD | Admitting: Family Medicine

## 2018-03-19 VITALS — BP 113/66 | HR 78 | Wt 231.0 lb

## 2018-03-19 DIAGNOSIS — E291 Testicular hypofunction: Secondary | ICD-10-CM | POA: Diagnosis not present

## 2018-03-19 MED ORDER — TESTOSTERONE CYPIONATE 200 MG/ML IM SOLN
200.0000 mg | INTRAMUSCULAR | Status: DC
Start: 2018-03-19 — End: 2018-04-02
  Administered 2018-03-19: 200 mg via INTRAMUSCULAR

## 2018-03-19 NOTE — Progress Notes (Signed)
   Subjective:    Patient ID: Shane Williams, male    DOB: 1970-11-04, 47 y.o.   MRN: [id number]  HPI Pt is here for testosterone injection. Denies chest pain, SOB, H/A and problems with medication or mood changes.   Review of Systems     Objective:   Physical Exam        Assessment & Plan:  Patient tolerated injection well without any complications. Pt advised to schedule next injection in 14 days. KG LPN

## 2018-04-02 ENCOUNTER — Ambulatory Visit (INDEPENDENT_AMBULATORY_CARE_PROVIDER_SITE_OTHER): Payer: BLUE CROSS/BLUE SHIELD | Admitting: Family Medicine

## 2018-04-02 VITALS — BP 123/70 | HR 89 | Temp 98.0°F

## 2018-04-02 DIAGNOSIS — R7989 Other specified abnormal findings of blood chemistry: Secondary | ICD-10-CM | POA: Diagnosis not present

## 2018-04-02 MED ORDER — TESTOSTERONE CYPIONATE 200 MG/ML IM SOLN
200.0000 mg | Freq: Once | INTRAMUSCULAR | Status: AC
  Administered 2018-04-02: 200 mg via INTRAMUSCULAR

## 2018-04-02 NOTE — Progress Notes (Signed)
   Subjective:    Patient ID: Shane Williams, male    DOB: 09/02/1971, 47 y.o.   MRN: [id number]  HPI Patient is here for a testosterone injection. Denies chest pain, shortness of breath, headaches, problems with mood change, or medication problems.    Review of Systems     Objective:   Physical Exam        Assessment & Plan:  Patient tolerated injection in ROUQ well without complications. Patient advised to schedule his next injection for 2 weeks from today. 

## 2018-04-15 ENCOUNTER — Ambulatory Visit (INDEPENDENT_AMBULATORY_CARE_PROVIDER_SITE_OTHER): Payer: BLUE CROSS/BLUE SHIELD | Admitting: Family Medicine

## 2018-04-15 VITALS — BP 134/70 | HR 82

## 2018-04-15 DIAGNOSIS — E291 Testicular hypofunction: Secondary | ICD-10-CM | POA: Diagnosis not present

## 2018-04-15 MED ORDER — TESTOSTERONE CYPIONATE 200 MG/ML IM SOLN
200.0000 mg | Freq: Once | INTRAMUSCULAR | Status: AC
  Administered 2018-04-15: 200 mg via INTRAMUSCULAR

## 2018-04-15 NOTE — Progress Notes (Signed)
Patient came into office today for testosterone injection. Denies chest pain, shortness of breath, headaches and problems associated with taking this medication. Patient states he has had no abnornal mood swings. Patient tolerated injection in RUOQ well without complications. Patient advised to schedule his next injection for 2 weeks from today. 

## 2018-04-30 ENCOUNTER — Ambulatory Visit (INDEPENDENT_AMBULATORY_CARE_PROVIDER_SITE_OTHER): Payer: BLUE CROSS/BLUE SHIELD | Admitting: Family Medicine

## 2018-04-30 VITALS — BP 122/70 | HR 92

## 2018-04-30 DIAGNOSIS — E291 Testicular hypofunction: Secondary | ICD-10-CM

## 2018-04-30 MED ORDER — TESTOSTERONE CYPIONATE 200 MG/ML IM SOLN
200.0000 mg | Freq: Once | INTRAMUSCULAR | Status: AC
  Administered 2018-04-30: 200 mg via INTRAMUSCULAR

## 2018-04-30 NOTE — Progress Notes (Signed)
Patient came into office today for testosterone injection. Denies chest pain, shortness of breath, headaches and problems associated with taking this medication. Patient states he has had no abnornal mood swings. Patient tolerated injection in LUOQ well without complications. Patient advised to schedule his next injection for 2 weeks from today. 

## 2018-05-08 ENCOUNTER — Other Ambulatory Visit: Payer: Self-pay | Admitting: Family Medicine

## 2018-05-13 ENCOUNTER — Ambulatory Visit (INDEPENDENT_AMBULATORY_CARE_PROVIDER_SITE_OTHER): Payer: BLUE CROSS/BLUE SHIELD | Admitting: Family Medicine

## 2018-05-13 VITALS — BP 120/79 | HR 112

## 2018-05-13 DIAGNOSIS — E291 Testicular hypofunction: Secondary | ICD-10-CM

## 2018-05-13 MED ORDER — TESTOSTERONE CYPIONATE 200 MG/ML IM SOLN
200.0000 mg | Freq: Once | INTRAMUSCULAR | Status: AC
Start: 2018-05-13 — End: 2018-05-13
  Administered 2018-05-13: 200 mg via INTRAMUSCULAR

## 2018-05-13 NOTE — Progress Notes (Signed)
Testosterone given RUOQ without complications.

## 2018-05-28 ENCOUNTER — Ambulatory Visit (INDEPENDENT_AMBULATORY_CARE_PROVIDER_SITE_OTHER): Payer: BLUE CROSS/BLUE SHIELD | Admitting: Osteopathic Medicine

## 2018-05-28 VITALS — BP 113/69 | HR 100 | Wt 230.0 lb

## 2018-05-28 DIAGNOSIS — R7989 Other specified abnormal findings of blood chemistry: Secondary | ICD-10-CM

## 2018-05-28 MED ORDER — TESTOSTERONE CYPIONATE 200 MG/ML IM SOLN
200.0000 mg | Freq: Once | INTRAMUSCULAR | Status: AC
Start: 2018-05-28 — End: 2018-05-28
  Administered 2018-05-28: 200 mg via INTRAMUSCULAR

## 2018-05-28 NOTE — Progress Notes (Signed)
Shane Williams presents to the clinic for a testosterone injection. Denies SOB, chest pain, headache, and mood changes. Pt tolerated injection in LUOQ well without complications.  Pt advise to make next appointment in 14 days. -[facility]

## 2018-06-05 ENCOUNTER — Ambulatory Visit: Payer: BLUE CROSS/BLUE SHIELD | Admitting: Family Medicine

## 2018-06-11 ENCOUNTER — Ambulatory Visit (INDEPENDENT_AMBULATORY_CARE_PROVIDER_SITE_OTHER): Payer: BLUE CROSS/BLUE SHIELD | Admitting: Family Medicine

## 2018-06-11 VITALS — BP 114/69 | HR 95 | Ht 65.0 in | Wt 230.0 lb

## 2018-06-11 DIAGNOSIS — E291 Testicular hypofunction: Secondary | ICD-10-CM | POA: Diagnosis not present

## 2018-06-11 DIAGNOSIS — R7989 Other specified abnormal findings of blood chemistry: Secondary | ICD-10-CM

## 2018-06-11 MED ORDER — TESTOSTERONE CYPIONATE 200 MG/ML IM SOLN
200.0000 mg | Freq: Once | INTRAMUSCULAR | Status: AC
  Administered 2018-06-11: 200 mg via INTRAMUSCULAR

## 2018-06-11 NOTE — Progress Notes (Signed)
Patient presents to the office to Testosterone injection in RUOQ. Patient denies any mood changes, shortness of breath, headaches, and chest pain. Patient tolerated injection well with no immediate complications. Patient was advised to schedule a follow up in 2 weeks and he voices understanding. No further questions were asked during the visit.

## 2018-06-11 NOTE — Patient Instructions (Signed)
Return in 2 weeks for a Testosterone injection and patient voices understanding.

## 2018-06-25 ENCOUNTER — Ambulatory Visit (INDEPENDENT_AMBULATORY_CARE_PROVIDER_SITE_OTHER): Payer: BLUE CROSS/BLUE SHIELD | Admitting: Family Medicine

## 2018-06-25 ENCOUNTER — Encounter: Payer: Self-pay | Admitting: Family Medicine

## 2018-06-25 VITALS — BP 104/69 | HR 61 | Temp 97.6°F | Wt 232.0 lb

## 2018-06-25 DIAGNOSIS — E119 Type 2 diabetes mellitus without complications: Secondary | ICD-10-CM

## 2018-06-25 DIAGNOSIS — E1169 Type 2 diabetes mellitus with other specified complication: Secondary | ICD-10-CM

## 2018-06-25 DIAGNOSIS — E785 Hyperlipidemia, unspecified: Secondary | ICD-10-CM

## 2018-06-25 DIAGNOSIS — E6609 Other obesity due to excess calories: Secondary | ICD-10-CM

## 2018-06-25 DIAGNOSIS — D582 Other hemoglobinopathies: Secondary | ICD-10-CM

## 2018-06-25 DIAGNOSIS — Z9189 Other specified personal risk factors, not elsewhere classified: Secondary | ICD-10-CM

## 2018-06-25 DIAGNOSIS — E291 Testicular hypofunction: Secondary | ICD-10-CM

## 2018-06-25 DIAGNOSIS — R7989 Other specified abnormal findings of blood chemistry: Secondary | ICD-10-CM

## 2018-06-25 DIAGNOSIS — Z6833 Body mass index (BMI) 33.0-33.9, adult: Secondary | ICD-10-CM

## 2018-06-25 DIAGNOSIS — N529 Male erectile dysfunction, unspecified: Secondary | ICD-10-CM

## 2018-06-25 DIAGNOSIS — B351 Tinea unguium: Secondary | ICD-10-CM

## 2018-06-25 LAB — POCT GLYCOSYLATED HEMOGLOBIN (HGB A1C): Hemoglobin A1C: 7.1 % — AB (ref 4.0–5.6)

## 2018-06-25 MED ORDER — TERBINAFINE HCL 250 MG PO TABS: 250.0000 mg | ORAL_TABLET | Freq: Every day | ORAL | 1 refills | Status: AC

## 2018-06-25 MED ORDER — TESTOSTERONE CYPIONATE 200 MG/ML IM SOLN
200.0000 mg | Freq: Once | INTRAMUSCULAR | Status: AC
  Administered 2018-06-25: 200 mg via INTRAMUSCULAR

## 2018-06-25 MED ORDER — TADALAFIL 20 MG PO TABS: 10.0000 mg | ORAL_TABLET | Freq: Every day | ORAL | 12 refills | Status: DC | PRN

## 2018-06-25 NOTE — Patient Instructions (Addendum)
Thank you for coming in today.  Continue current medicine.   Get labs between testosterone injections anytime after Nov 5th.  Labs should be fasting and in the morning.   Recheck with me in 4 months or sooner if needed.   Get diabetic eye exam soon. [phone]  Start tebinafine daily for toenail fungus.   Onychomycosis/Fungal Toenails  WHAT IS IT? An infection that lies within the keratin of your nail plate that is caused by a fungus.  WHY ME? Fungal infections affect all ages, sexes, races, and creeds.  There may be many factors that predispose you to a fungal infection such as age, coexisting medical conditions such as diabetes, or an autoimmune disease; stress, medications, fatigue, genetics, etc.  Bottom line: fungus thrives in a warm, moist environment and your shoes offer such a location.  IS IT CONTAGIOUS? Theoretically, yes.  You do not want to share shoes, nail clippers or files with someone who has fungal toenails.  Walking around barefoot in the same room or sleeping in the same bed is unlikely to transfer the organism.  It is important to realize, however, that fungus can spread easily from one nail to the next on the same foot.  HOW DO WE TREAT THIS?  There are several ways to treat this condition.  Treatment may depend on many factors such as age, medications, pregnancy, liver and kidney conditions, etc.  It is best to ask your doctor which options are available to you.  1. No treatment.   Unlike many other medical concerns, you can live with this condition.  However for many people this can be a painful condition and may lead to ingrown toenails or a bacterial infection.  It is recommended that you keep the nails cut short to help reduce the amount of fungal nail. 2. Topical treatment.  These range from herbal remedies to prescription strength nail lacquers.  About 40-50% effective, topicals require twice daily application for approximately 9 to 12 months or until an  entirely new nail has grown out.  The most effective topicals are medical grade medications available through physicians offices. 3. Oral antifungal medications.  With an 80-90% cure rate, the most common oral medication requires 3 to 4 months of therapy and stays in your system for a year as the new nail grows out.  Oral antifungal medications do require blood work to make sure it is a safe drug for you.  A liver function panel will be performed prior to starting the medication and after the first month of treatment.  It is important to have the blood work performed to avoid any harmful side effects.  In general, this medication safe but blood work is required. 4. Laser Therapy.  This treatment is performed by applying a specialized laser to the affected nail plate.  This therapy is noninvasive, fast, and non-painful.  It is not covered by insurance and is therefore, out of pocket.  The results have been very good with a 80-95% cure rate.  The [facility] is the only practice in the area to offer this therapy. 5. Permanent Nail Avulsion.  Removing the entire nail so that a new nail will not grow back.

## 2018-06-25 NOTE — Progress Notes (Signed)
Shane Williams is a 47 y.o. male who presents to [facility]: Primary Care Sports Medicine today for follow-up diabetes low testosterone hyperlipidemia elevated hemoglobin smoking.  Shane Williams has a history of diabetes that is well controlled with medications listed below.  He notes his blood sugars are typically in the 90s to 110s.  He denies any hypoglycemic episodes.  He feels well.  He thinks he is overdue for his diabetic eye exam.  Low testosterone: Shane Williams gets regular testosterone injections for low testosterone.  He notes the 200 mg of Depo-Testosterone every 2 weeks works well and improves his libido.  Hyperlipidemia: Patient takes atorvastatin daily for elevated cholesterol.  No chest pain palpitations shortness of breath.  Elevated hemoglobin: Patient has had mildly elevated hemoglobin previously.  He is working on cutting back on smoking.  He thinks he is due for recheck.  Smoking: Patient continues to smoke is not ready to quit but has cut back a bit.   ROS as above:  Exam:  BP 104/69   Pulse 61   Temp 97.6 F (36.4 C) (Oral)   Wt 232 lb (105.2 kg)   BMI 38.61 kg/m  Wt Readings from Last 5 Encounters:  06/25/18 232 lb (105.2 kg)  06/11/18 230 lb (104.3 kg)  05/28/18 230 lb (104.3 kg)  03/19/18 231 lb (104.8 kg)  03/05/18 229 lb (103.9 kg)    Gen: Well NAD HEENT: EOMI,  MMM Lungs: Normal work of breathing. CTABL Heart: RRR no MRG Abd: NABS, Soft. Nondistended, Nontender Exts: Brisk capillary refill, warm and well perfused.  Feet bilaterally have onychomycosis great toenails and mild tinea pedis fifth toes bilaterally  Lab and Radiology Results Results for orders placed or performed in visit on 06/25/18 (from the past 72 hour(s))  POCT HgB A1C     Status: Abnormal   Collection Time: 06/25/18  8:39 AM  Result Value Ref Range   Hemoglobin A1C 7.1 (A) 4.0 - 5.6 %   HbA1c POC  (<> result, manual entry)     HbA1c, POC (prediabetic range)     HbA1c, POC (controlled diabetic range)     No results found.    Assessment and Plan: 47 y.o. male with  Diabetes significant improvement.  Continue medication and reduce carbohydrate diet.  Recheck in 4 months.  Continue to work on weight loss.  Testosterone: Clinically doing well.  Recheck fasting testosterone labs in about 2-1/2 months.  Continue current regimen.  Hyperlipidemia: Recheck fasting lipids 2-1/2 months.  Elevated hemoglobin: Recheck hemoglobin 2 and half months with testosterone labs.  Onychomycosis: New diagnosis.  Start terbinafine recheck in 4 months.  Check labs as above.  Health maintenance: Due for diabetic eye exam.  Recommend patient call ophthalmologist. Patient declined influenza vaccine.   Orders Placed This Encounter  Procedures  . CBC  . COMPLETE METABOLIC PANEL WITH GFR  . Lipid Panel w/reflex Direct LDL  . Hemoglobin A1c  . Testosterone  . PSA  . POCT HgB A1C   Meds ordered this encounter  Medications  . tadalafil (ADCIRCA/CIALIS) 20 MG tablet    Sig: Take 0.5-1 tablets (10-20 mg total) by mouth daily as needed for erectile dysfunction.    Dispense:  30 tablet    Refill:  12  . testosterone cypionate (DEPOTESTOSTERONE CYPIONATE) injection 200 mg  . terbinafine (LAMISIL) 250 MG tablet    Sig: Take 1 tablet (250 mg total) by mouth daily.    Dispense:  90 tablet  Refill:  1     Historical information moved to improve visibility of documentation.  Past Medical History:  Diagnosis Date  . Low testosterone 11/27/2012  . Type 2 diabetes mellitus (HCC) 11/27/2012   Past Surgical History:  Procedure Laterality Date  . APPENDECTOMY    . INCISE AND DRAIN ABCESS     4 times in last 2 years  . SHOULDER ARTHROSCOPY     Social History   Tobacco Use  . Smoking status: Current Every Day Smoker    Packs/day: 0.25    Last attempt to quit: 05/12/2017    Years since quitting: 1.1    . Smokeless tobacco: Never Used  Substance Use Topics  . Alcohol use: Yes   family history includes Diabetes in his father.  Medications: Current Outpatient Medications  Medication Sig Dispense Refill  . AMBULATORY NON FORMULARY MEDICATION One Touch Ultra Blue Test Strips.  Diagnosis: Diabetes Mellitus. Test 2 times a day 100 strip 4  . aspirin EC 81 MG tablet Take 1 tablet (81 mg total) by mouth daily. 90 tablet 3  . atorvastatin (LIPITOR) 40 MG tablet Take 1 tablet (40 mg total) by mouth daily. 90 tablet 2  . Dapagliflozin-metFORMIN HCl ER (XIGDUO XR) 02-999 MG TB24 Take 5-1,000 mg by mouth 2 (two) times daily. Evening dose can be cut to half a tablet if needed. 180 tablet 1  . Dulaglutide (TRULICITY) 0.75 MG/0.5ML SOPN Inject 0.5 mLs into the skin once a week. 4 pen 11  . Lancets (ONETOUCH ULTRASOFT) lancets Use as directed 60 each 5  . metroNIDAZOLE (METROGEL) 1 % gel Apply topically 2 (two) times daily. 60 g 1  . tadalafil (ADCIRCA/CIALIS) 20 MG tablet Take 0.5-1 tablets (10-20 mg total) by mouth daily as needed for erectile dysfunction. 30 tablet 12  . testosterone cypionate (DEPOTESTOTERONE CYPIONATE) 100 MG/ML injection Inject 200 mg into the muscle every 14 (fourteen) days. For IM use only     . terbinafine (LAMISIL) 250 MG tablet Take 1 tablet (250 mg total) by mouth daily. 90 tablet 1   No current facility-administered medications for this visit.    Allergies  Allergen Reactions  . Penicillins Swelling  . Viagra [Sildenafil Citrate]     headache     Discussed warning signs or symptoms. Please see discharge instructions. Patient expresses understanding.

## 2018-07-09 ENCOUNTER — Ambulatory Visit (INDEPENDENT_AMBULATORY_CARE_PROVIDER_SITE_OTHER): Payer: BLUE CROSS/BLUE SHIELD | Admitting: Family Medicine

## 2018-07-09 VITALS — BP 116/71 | HR 89 | Temp 98.2°F | Wt 230.0 lb

## 2018-07-09 DIAGNOSIS — E291 Testicular hypofunction: Secondary | ICD-10-CM | POA: Diagnosis not present

## 2018-07-09 MED ORDER — TESTOSTERONE CYPIONATE 200 MG/ML IM SOLN
200.0000 mg | Freq: Once | INTRAMUSCULAR | Status: AC
  Administered 2018-07-09: 200 mg via INTRAMUSCULAR

## 2018-07-09 NOTE — Progress Notes (Signed)
   Subjective:    Patient ID: Shane CocaJoseph Janeway, male    DOB: [birth year], 47 y.o.   MRN: [id number]  HPI Patient is here for a testosterone injection. Denies chest pain, shortness of breath, headaches, problems with mood change, or medication problems.    Review of Systems     Objective:   Physical Exam        Assessment & Plan:  Patient tolerated injection in ROUQ well without complications. Patient advised to schedule his next injection for 2 weeks from today.

## 2018-07-23 ENCOUNTER — Ambulatory Visit (INDEPENDENT_AMBULATORY_CARE_PROVIDER_SITE_OTHER): Payer: BLUE CROSS/BLUE SHIELD | Admitting: Family Medicine

## 2018-07-23 VITALS — BP 119/73 | HR 87 | Temp 97.5°F | Wt 229.2 lb

## 2018-07-23 DIAGNOSIS — E291 Testicular hypofunction: Secondary | ICD-10-CM | POA: Diagnosis not present

## 2018-07-23 MED ORDER — TESTOSTERONE CYPIONATE 200 MG/ML IM SOLN
200.0000 mg | Freq: Once | INTRAMUSCULAR | Status: AC
  Administered 2018-07-23: 200 mg via INTRAMUSCULAR

## 2018-07-23 NOTE — Progress Notes (Signed)
Pt is here for testosterone injection. Denies chest pain, SOB, headaches or mood changes. No problems with medication. As per pt "feeling great". Bp reading was 119/73, pulse 87. Pt tolerated injection well on LUOQ without complications. Pt advised to schedule next injection in 2 weeks.

## 2018-08-06 ENCOUNTER — Ambulatory Visit (INDEPENDENT_AMBULATORY_CARE_PROVIDER_SITE_OTHER): Payer: BLUE CROSS/BLUE SHIELD | Admitting: Family Medicine

## 2018-08-06 VITALS — BP 126/67 | HR 89 | Ht 65.0 in | Wt 229.0 lb

## 2018-08-06 DIAGNOSIS — E291 Testicular hypofunction: Secondary | ICD-10-CM | POA: Diagnosis not present

## 2018-08-06 MED ORDER — TESTOSTERONE CYPIONATE 200 MG/ML IM SOLN
200.0000 mg | Freq: Once | INTRAMUSCULAR | Status: AC
  Administered 2018-08-06: 200 mg via INTRAMUSCULAR

## 2018-08-06 NOTE — Progress Notes (Signed)
Patient is present to clinic today for a testosterone injection. Patient denies chest pain, shortness of breath, headaches, or mood changes. No problems with medication. Bp reading was 126/67, pulse 89. Testosterone injection given in the RUOQ without complications. Patient was advised to schedule next testosterone injection in 2 weeks.

## 2018-08-07 ENCOUNTER — Ambulatory Visit: Payer: BLUE CROSS/BLUE SHIELD | Admitting: Family Medicine

## 2018-08-14 ENCOUNTER — Other Ambulatory Visit: Payer: Self-pay | Admitting: Family Medicine

## 2018-08-20 ENCOUNTER — Ambulatory Visit (INDEPENDENT_AMBULATORY_CARE_PROVIDER_SITE_OTHER): Payer: BLUE CROSS/BLUE SHIELD | Admitting: Family Medicine

## 2018-08-20 VITALS — BP 131/74 | HR 85 | Wt 231.0 lb

## 2018-08-20 DIAGNOSIS — E291 Testicular hypofunction: Secondary | ICD-10-CM

## 2018-08-20 MED ORDER — TESTOSTERONE CYPIONATE 200 MG/ML IM SOLN
100.0000 mg | Freq: Once | INTRAMUSCULAR | Status: AC
  Administered 2018-08-20: 100 mg via INTRAMUSCULAR

## 2018-08-20 NOTE — Progress Notes (Signed)
   Subjective:    Patient ID: Shane Williams, male    DOB: 1971-09-17, 47 y.o.   MRN: [id number]  HPI Pt in office today for testosterone injection. No complaints of fatigue, SOB, palpitations, chest pain or mood changes. Patient tolerated injection well without any complications. KG LPN   Review of Systems     Objective:   Physical Exam        Assessment & Plan:  Advised pt to return in 2 weeks for next injection. KG LPN

## 2018-09-03 ENCOUNTER — Ambulatory Visit (INDEPENDENT_AMBULATORY_CARE_PROVIDER_SITE_OTHER): Payer: BLUE CROSS/BLUE SHIELD | Admitting: Family Medicine

## 2018-09-03 VITALS — BP 120/62 | HR 85

## 2018-09-03 DIAGNOSIS — E291 Testicular hypofunction: Secondary | ICD-10-CM | POA: Diagnosis not present

## 2018-09-03 MED ORDER — TESTOSTERONE CYPIONATE 200 MG/ML IM SOLN
200.0000 mg | Freq: Once | INTRAMUSCULAR | Status: AC
  Administered 2018-09-03: 200 mg via INTRAMUSCULAR

## 2018-09-03 NOTE — Progress Notes (Signed)
Patient came into office today for testosterone injection. Denies chest pain, shortness of breath, headaches and problems associated with taking this medication. Patient states he has had no abnornal mood swings. Patient tolerated injection in LUOQ well without complications. Patient advised to schedule his next injection for 2 weeks from today. 

## 2018-09-16 ENCOUNTER — Ambulatory Visit (INDEPENDENT_AMBULATORY_CARE_PROVIDER_SITE_OTHER): Payer: BLUE CROSS/BLUE SHIELD | Admitting: Family Medicine

## 2018-09-16 VITALS — BP 121/71 | HR 90 | Temp 97.9°F | Wt 229.0 lb

## 2018-09-16 DIAGNOSIS — E291 Testicular hypofunction: Secondary | ICD-10-CM | POA: Diagnosis not present

## 2018-09-16 MED ORDER — TESTOSTERONE CYPIONATE 200 MG/ML IM SOLN
200.0000 mg | Freq: Once | INTRAMUSCULAR | Status: AC
  Administered 2018-09-16: 200 mg via INTRAMUSCULAR

## 2018-09-16 NOTE — Progress Notes (Signed)
Patient is present to clinic today for a testosterone injection. Patient denies chest pain, shortness of breath, headaches, or mood changes. No problems with medication. Bp reading was 121/71, pulse  89 . Testosterone injection given in the LUOQ without complications. Patient was advised to schedule next testosterone injection in 2 weeks.

## 2018-09-30 ENCOUNTER — Ambulatory Visit (INDEPENDENT_AMBULATORY_CARE_PROVIDER_SITE_OTHER): Payer: BLUE CROSS/BLUE SHIELD | Admitting: Family Medicine

## 2018-09-30 VITALS — BP 127/75 | HR 97 | Temp 97.0°F | Wt 231.0 lb

## 2018-09-30 DIAGNOSIS — E291 Testicular hypofunction: Secondary | ICD-10-CM | POA: Diagnosis not present

## 2018-09-30 MED ORDER — TESTOSTERONE CYPIONATE 200 MG/ML IM SOLN
200.0000 mg | Freq: Once | INTRAMUSCULAR | Status: AC
  Administered 2018-09-30: 200 mg via INTRAMUSCULAR

## 2018-09-30 NOTE — Progress Notes (Signed)
Patient is present to clinic today for a testosterone injection. Patient denies chest pain, shortness of breath, headaches, or mood changes. No problems with medication. Bp reading was 231 LB , pulse 97   . Testosterone injection given in the RUOQ without complications. Patient was advised to schedule next testosterone injection in 2 weeks.

## 2018-10-19 ENCOUNTER — Ambulatory Visit: Payer: BLUE CROSS/BLUE SHIELD

## 2018-10-21 ENCOUNTER — Other Ambulatory Visit: Payer: Self-pay | Admitting: Family Medicine

## 2018-10-26 ENCOUNTER — Ambulatory Visit (INDEPENDENT_AMBULATORY_CARE_PROVIDER_SITE_OTHER): Payer: BLUE CROSS/BLUE SHIELD | Admitting: Family Medicine

## 2018-10-26 ENCOUNTER — Encounter: Payer: Self-pay | Admitting: Family Medicine

## 2018-10-26 ENCOUNTER — Ambulatory Visit: Payer: BLUE CROSS/BLUE SHIELD

## 2018-10-26 VITALS — BP 123/77 | HR 87

## 2018-10-26 DIAGNOSIS — D582 Other hemoglobinopathies: Secondary | ICD-10-CM

## 2018-10-26 DIAGNOSIS — R7989 Other specified abnormal findings of blood chemistry: Secondary | ICD-10-CM

## 2018-10-26 DIAGNOSIS — B351 Tinea unguium: Secondary | ICD-10-CM

## 2018-10-26 DIAGNOSIS — E6609 Other obesity due to excess calories: Secondary | ICD-10-CM | POA: Diagnosis not present

## 2018-10-26 DIAGNOSIS — E1169 Type 2 diabetes mellitus with other specified complication: Secondary | ICD-10-CM

## 2018-10-26 DIAGNOSIS — Z6833 Body mass index (BMI) 33.0-33.9, adult: Secondary | ICD-10-CM

## 2018-10-26 DIAGNOSIS — E291 Testicular hypofunction: Secondary | ICD-10-CM | POA: Diagnosis not present

## 2018-10-26 DIAGNOSIS — E785 Hyperlipidemia, unspecified: Secondary | ICD-10-CM

## 2018-10-26 DIAGNOSIS — E119 Type 2 diabetes mellitus without complications: Secondary | ICD-10-CM | POA: Diagnosis not present

## 2018-10-26 MED ORDER — TESTOSTERONE CYPIONATE 200 MG/ML IM SOLN
200.0000 mg | Freq: Once | INTRAMUSCULAR | Status: AC
  Administered 2018-10-26: 200 mg via INTRAMUSCULAR

## 2018-10-26 MED ORDER — DAPAGLIFLOZIN PRO-METFORMIN ER 10-1000 MG PO TB24: 1.0000 | ORAL_TABLET | Freq: Every day | ORAL | 1 refills | Status: DC

## 2018-10-26 MED ORDER — TERBINAFINE HCL 250 MG PO TABS: 250.0000 mg | ORAL_TABLET | Freq: Every day | ORAL | 0 refills | Status: DC

## 2018-10-26 NOTE — Progress Notes (Signed)
Shane Williams is a 48 y.o. male who presents to [name]: Primary Care Sports Medicine today for diabetes, low testosterone hyperlipidemia onychomycosis and elevated hemoglobin.  Diabetes: Patient takes Xigduo XR 02/999 mg daily.  He had trouble tolerating the p.m. dose of this medicine and decreased to daily which he tolerates.  He felt fatigued on the higher dose.  He notes his sugars are typically pretty well controlled.  He tries to eat a lower carbohydrate diet.  He takes atorvastatin for hyperlipidemia.  He tolerates it well without significant muscle aches or pains.  Low testosterone: Receives testosterone injections every 2 weeks.  He notes this medication all increases libido and energy.  He feels well on the current dose.  Onychomycosis: Noted previously currently taking terbinafine.  He notes slow improvement appearance of toenails over the last several months.  Elevated hemoglobin: Shane Williams has had elevated hemoglobin.  He is due for recheck.  Shane Williams is not ready to quit smoking   ROS as above:  Exam:  BP 123/77   Pulse 87  Wt Readings from Last 5 Encounters:  09/30/18 231 lb (104.8 kg)  09/16/18 229 lb (103.9 kg)  08/20/18 231 lb (104.8 kg)  08/06/18 229 lb (103.9 kg)  07/23/18 229 lb 3.2 oz (104 kg)    Gen: Well NAD HEENT: EOMI,  MMM Lungs: Normal work of breathing. CTABL Heart: RRR no MRG Abd: NABS, Soft. Nondistended, Nontender Exts: Brisk capillary refill, warm and well perfused.  Foot exam: Onychomycosis toenails bilaterally.  Clearing of proximal nail edges great toes bilaterally  Lab and Radiology Results No results found for this or any previous visit (from the past 72 hour(s)). No results found.    Assessment and Plan: 48 y.o. male with  Diabetes: A1c pending.  Typically well controlled.  Increase Xigduo XR to 07/999 mg daily.  Recheck 3 months.  Courage  patient to get diabetic eye exam.  Hyperlipidemia: Lipid panel pending.  Tolerating it well.  Onychomycosis: Improving.  Continue terbinafine.  Metabolic panel pending.  Elevated hemoglobin: CBC pending.  Continue to adjust.  Testosterone: Testosterone panel pending.  Patient had labs done today and received a testosterone injection today on normal cycle.  Expect it will be a bit low which  Smoking: Patient is not ready to quit smoking today.  PDMP not reviewed this encounter. No orders of the defined types were placed in this encounter.  Meds ordered this encounter  Medications  . testosterone cypionate (DEPOTESTOSTERONE CYPIONATE) injection 200 mg  . Dapagliflozin-metFORMIN HCl ER (XIGDUO XR) 07-999 MG TB24    Sig: Take 1 tablet by mouth daily.    Dispense:  90 tablet    Refill:  1    Replaces prior dose  . terbinafine (LAMISIL) 250 MG tablet    Sig: Take 1 tablet (250 mg total) by mouth daily.    Dispense:  90 tablet    Refill:  0     Historical information moved to improve visibility of documentation.  Past Medical History:  Diagnosis Date  . Low testosterone 11/27/2012  . Type 2 diabetes mellitus (HCC) 11/27/2012   Past Surgical History:  Procedure Laterality Date  . APPENDECTOMY    . INCISE AND DRAIN ABCESS     4 times in last 2 years  . SHOULDER ARTHROSCOPY     Social History   Tobacco Use  . Smoking status: Current Every Day Smoker    Packs/day: 0.75    Last  attempt to quit: 05/12/2017    Years since quitting: 1.4  . Smokeless tobacco: Never Used  Substance Use Topics  . Alcohol use: Yes   family history includes Diabetes in his father.  Medications: Current Outpatient Medications  Medication Sig Dispense Refill  . AMBULATORY NON FORMULARY MEDICATION One Touch Ultra Blue Test Strips.  Diagnosis: Diabetes Mellitus. Test 2 times a day 100 strip 4  . aspirin EC 81 MG tablet Take 1 tablet (81 mg total) by mouth daily. 90 tablet 3  . atorvastatin (LIPITOR)  40 MG tablet TAKE 1 TABLET BY MOUTH EVERY DAY 90 tablet 0  . Dulaglutide (TRULICITY) 0.75 MG/0.5ML SOPN Inject 0.5 mLs into the skin once a week. 4 pen 11  . Lancets (ONETOUCH ULTRASOFT) lancets Use as directed 60 each 5  . metroNIDAZOLE (METROGEL) 1 % gel Apply topically 2 (two) times daily. 60 g 1  . tadalafil (ADCIRCA/CIALIS) 20 MG tablet Take 0.5-1 tablets (10-20 mg total) by mouth daily as needed for erectile dysfunction. 30 tablet 12  . Dapagliflozin-metFORMIN HCl ER (XIGDUO XR) 07-999 MG TB24 Take 1 tablet by mouth daily. 90 tablet 1  . terbinafine (LAMISIL) 250 MG tablet Take 1 tablet (250 mg total) by mouth daily. 90 tablet 0   No current facility-administered medications for this visit.    Allergies  Allergen Reactions  . Penicillins Swelling  . Viagra [Sildenafil Citrate]     headache     Discussed warning signs or symptoms. Please see discharge instructions. Patient expresses understanding.

## 2018-10-26 NOTE — Progress Notes (Signed)
Patient tolerated testosterone injection in LUOQ well without complications. Patient advised to schedule his next injection for 2 weeks from today. 

## 2018-10-26 NOTE — Patient Instructions (Signed)
Thank you for coming in today. Get an eye exam.  Switch xigduo to 07/999 daily.  Recheck in 3 months or sooner if needed.  I will get lab results to you tomorrow.   Try to get you diabetic eye exam.

## 2018-10-27 LAB — COMPLETE METABOLIC PANEL WITH GFR
AG RATIO: 1.8 (calc) (ref 1.0–2.5)
ALKALINE PHOSPHATASE (APISO): 64 U/L (ref 40–115)
ALT: 43 U/L (ref 9–46)
AST: 26 U/L (ref 10–40)
Albumin: 4.5 g/dL (ref 3.6–5.1)
BUN: 14 mg/dL (ref 7–25)
CALCIUM: 9.4 mg/dL (ref 8.6–10.3)
CO2: 29 mmol/L (ref 20–32)
Chloride: 102 mmol/L (ref 98–110)
Creat: 1.06 mg/dL (ref 0.60–1.35)
GFR, EST NON AFRICAN AMERICAN: 83 mL/min/{1.73_m2} (ref >=60)
GFR, Est African American: 96 mL/min/{1.73_m2} (ref >=60)
GLOBULIN: 2.5 g/dL (ref 1.9–3.7)
Glucose, Bld: 134 mg/dL — ABNORMAL HIGH (ref 65–99)
POTASSIUM: 4.2 mmol/L (ref 3.5–5.3)
SODIUM: 139 mmol/L (ref 135–146)
Total Bilirubin: 1 mg/dL (ref 0.2–1.2)
Total Protein: 7 g/dL (ref 6.1–8.1)

## 2018-10-27 LAB — LIPID PANEL W/REFLEX DIRECT LDL
Cholesterol: 141 mg/dL (ref <200)
HDL: 33 mg/dL — AB (ref >40)
LDL Cholesterol (Calc): 81 mg/dL (calc)
NON-HDL CHOLESTEROL (CALC): 108 mg/dL (ref <130)
Total CHOL/HDL Ratio: 4.3 (calc) (ref <5.0)
Triglycerides: 168 mg/dL — ABNORMAL HIGH (ref <150)

## 2018-10-27 LAB — CBC
HEMATOCRIT: 56.7 % — AB (ref 38.5–50.0)
HEMOGLOBIN: 19.3 g/dL — AB (ref 13.2–17.1)
MCH: 29.3 pg (ref 27.0–33.0)
MCHC: 34 g/dL (ref 32.0–36.0)
MCV: 86 fL (ref 80.0–100.0)
MPV: 9.4 fL (ref 7.5–12.5)
PLATELETS: 218 10*3/uL (ref 140–400)
RBC: 6.59 10*6/uL — AB (ref 4.20–5.80)
RDW: 13.4 % (ref 11.0–15.0)
WBC: 6.4 10*3/uL (ref 3.8–10.8)

## 2018-10-27 LAB — HEMOGLOBIN A1C
EAG (MMOL/L): 8.5 (calc)
Hgb A1c MFr Bld: 7 % of total Hgb — ABNORMAL HIGH (ref <5.7)
MEAN PLASMA GLUCOSE: 154 (calc)

## 2018-10-27 LAB — TESTOSTERONE: TESTOSTERONE: 151 ng/dL — AB (ref 250–827)

## 2018-10-27 LAB — PSA: PSA: 0.4 ng/mL (ref <=4.0)

## 2018-11-09 ENCOUNTER — Ambulatory Visit (INDEPENDENT_AMBULATORY_CARE_PROVIDER_SITE_OTHER): Payer: BLUE CROSS/BLUE SHIELD | Admitting: Sports Medicine

## 2018-11-09 VITALS — BP 107/69 | HR 103 | Ht 72.0 in | Wt 231.0 lb

## 2018-11-09 DIAGNOSIS — R7989 Other specified abnormal findings of blood chemistry: Secondary | ICD-10-CM | POA: Diagnosis not present

## 2018-11-09 MED ORDER — TESTOSTERONE CYPIONATE 200 MG/ML IM SOLN: 200.0000 mg | Freq: Once | INTRAMUSCULAR | Status: DC

## 2018-11-09 MED ORDER — TESTOSTERONE CYPIONATE 200 MG/ML IM SOLN
100.0000 mg | Freq: Once | INTRAMUSCULAR | Status: AC
  Administered 2018-11-09: 100 mg via INTRAMUSCULAR

## 2018-11-09 NOTE — Progress Notes (Signed)
   Subjective:    Patient ID: Shane Williams, male    DOB: November 27, 1970, 49 y.o.   MRN: [id number]  HPI Patient is here for a testosterone injection. Denies chest pain, shortness of breath, headaches, problems with mood change, or medication problems.  Reviewed recent labs with pt and advised him of DECREASE in dose of 100 mg testosterone due to elevated hemoglobin.   Review of Systems     Objective:   Physical Exam        Assessment & Plan:  Patient tolerated injection in LOUQ well without complications. Patient advised to schedule his next injection for 2 weeks from today.

## 2018-11-23 ENCOUNTER — Ambulatory Visit (INDEPENDENT_AMBULATORY_CARE_PROVIDER_SITE_OTHER): Payer: BLUE CROSS/BLUE SHIELD | Admitting: Family Medicine

## 2018-11-23 VITALS — BP 113/75 | HR 85 | Temp 97.6°F | Wt 232.0 lb

## 2018-11-23 DIAGNOSIS — R7989 Other specified abnormal findings of blood chemistry: Secondary | ICD-10-CM | POA: Diagnosis not present

## 2018-11-23 MED ORDER — TESTOSTERONE CYPIONATE 200 MG/ML IM SOLN
100.0000 mg | Freq: Once | INTRAMUSCULAR | Status: AC
  Administered 2018-11-23: 100 mg via INTRAMUSCULAR

## 2018-11-23 NOTE — Progress Notes (Signed)
Pt in today for testosterone injection. Medications and allergies reviewed. Pt denies chest pain, shortness of breath, headaches and denies problems with medication or mood changes. Pt received 100 mg testosterone in LUOQ.Patient tolerated injection well without complications. Pt advised to schedule next injection in 14 days.  

## 2018-12-08 ENCOUNTER — Ambulatory Visit (INDEPENDENT_AMBULATORY_CARE_PROVIDER_SITE_OTHER): Payer: BLUE CROSS/BLUE SHIELD | Admitting: Family Medicine

## 2018-12-08 VITALS — BP 118/74 | HR 84 | Ht 72.0 in | Wt 228.0 lb

## 2018-12-08 DIAGNOSIS — R7989 Other specified abnormal findings of blood chemistry: Secondary | ICD-10-CM

## 2018-12-08 DIAGNOSIS — E291 Testicular hypofunction: Secondary | ICD-10-CM | POA: Diagnosis not present

## 2018-12-08 MED ORDER — TESTOSTERONE CYPIONATE 200 MG/ML IM SOLN
100.0000 mg | Freq: Once | INTRAMUSCULAR | Status: AC
  Administered 2018-12-08: 100 mg via INTRAMUSCULAR

## 2018-12-08 NOTE — Progress Notes (Signed)
   Subjective:    Patient ID: Shane Williams, male    DOB: [birth year], 48 y.o.   MRN: [id number]  HPI Patient is here for a testosterone injection. Denies chest pain, shortness of breath, headaches, problems with mood change, or medication problems.   Review of Systems     Objective:   Physical Exam        Assessment & Plan:  Patient tolerated injection in LOUQ well without complications. Patient advised to schedule his next injection for 2 weeks from today.

## 2018-12-14 DIAGNOSIS — H0011 Chalazion right upper eyelid: Secondary | ICD-10-CM | POA: Diagnosis not present

## 2018-12-15 ENCOUNTER — Telehealth: Payer: Self-pay | Admitting: Family Medicine

## 2018-12-15 NOTE — Telephone Encounter (Signed)
Patient was seen by ophthalmology.  He had chalazion right upper eyelid.  Plan for warm compress and if not improved next up would be I&D.  We will proceed with diabetic eye exam once chalazion has resolved.

## 2018-12-22 ENCOUNTER — Ambulatory Visit: Payer: BLUE CROSS/BLUE SHIELD | Admitting: Family Medicine

## 2018-12-22 VITALS — BP 117/66 | HR 84

## 2018-12-22 DIAGNOSIS — E291 Testicular hypofunction: Secondary | ICD-10-CM | POA: Diagnosis not present

## 2018-12-22 MED ORDER — TESTOSTERONE CYPIONATE 200 MG/ML IM SOLN
100.0000 mg | Freq: Once | INTRAMUSCULAR | Status: AC
  Administered 2018-12-22: 100 mg via INTRAMUSCULAR

## 2018-12-22 NOTE — Progress Notes (Signed)
Patient came into office today for testosterone injection. Denies chest pain, shortness of breath, headaches and problems associated with taking this medication. Does report some increased fatigue since cutting his testosterone dose to 100mg . Patient states he has had no abnornal mood swings. Patient tolerated injection in RUOQ well without complications. Patient advised to schedule his next injection for 2 weeks from today.

## 2018-12-25 DIAGNOSIS — H01006 Unspecified blepharitis left eye, unspecified eyelid: Secondary | ICD-10-CM | POA: Diagnosis not present

## 2018-12-25 DIAGNOSIS — E119 Type 2 diabetes mellitus without complications: Secondary | ICD-10-CM | POA: Diagnosis not present

## 2018-12-25 DIAGNOSIS — H0011 Chalazion right upper eyelid: Secondary | ICD-10-CM | POA: Diagnosis not present

## 2018-12-25 DIAGNOSIS — H40003 Preglaucoma, unspecified, bilateral: Secondary | ICD-10-CM | POA: Diagnosis not present

## 2018-12-25 LAB — HM DIABETES EYE EXAM

## 2018-12-29 ENCOUNTER — Telehealth: Payer: Self-pay | Admitting: Family Medicine

## 2018-12-29 NOTE — Telephone Encounter (Signed)
Received ophthalmology report.  Date of service December 25, 2018.  Patient has no diabetic retinopathy.  He does have glaucoma suspect and chalazion.

## 2019-01-06 ENCOUNTER — Other Ambulatory Visit: Payer: Self-pay

## 2019-01-06 ENCOUNTER — Ambulatory Visit (INDEPENDENT_AMBULATORY_CARE_PROVIDER_SITE_OTHER): Payer: BLUE CROSS/BLUE SHIELD | Admitting: Family Medicine

## 2019-01-06 VITALS — BP 118/70 | HR 95

## 2019-01-06 DIAGNOSIS — E291 Testicular hypofunction: Secondary | ICD-10-CM | POA: Diagnosis not present

## 2019-01-06 MED ORDER — TESTOSTERONE CYPIONATE 200 MG/ML IM SOLN
100.0000 mg | Freq: Once | INTRAMUSCULAR | Status: AC
  Administered 2019-01-06: 100 mg via INTRAMUSCULAR

## 2019-01-06 NOTE — Progress Notes (Signed)
Patient came into office today for testosterone injection. Denies chest pain, shortness of breath, headaches and problems associated with taking this medication. Patient states he has had no abnornal mood swings. Patient tolerated injection in LUOQ well without complications. Patient advised to schedule his next injection for 2 weeks from today. 

## 2019-01-19 ENCOUNTER — Telehealth: Payer: Self-pay | Admitting: Family Medicine

## 2019-01-19 NOTE — Telephone Encounter (Signed)
-----   Message from Rodolph Bong, MD sent at 01/19/2019  9:04 AM EDT ----- Regarding: WebEx April 6 Please schedule upcoming visit on April 6 for WebEx

## 2019-01-19 NOTE — Telephone Encounter (Signed)
Left message with information below and for patient to call us back to scheduele the webex.  °

## 2019-01-21 ENCOUNTER — Ambulatory Visit (INDEPENDENT_AMBULATORY_CARE_PROVIDER_SITE_OTHER): Payer: BLUE CROSS/BLUE SHIELD | Admitting: Family Medicine

## 2019-01-21 ENCOUNTER — Other Ambulatory Visit: Payer: Self-pay

## 2019-01-21 VITALS — BP 123/70 | HR 81

## 2019-01-21 DIAGNOSIS — E291 Testicular hypofunction: Secondary | ICD-10-CM

## 2019-01-21 MED ORDER — TESTOSTERONE CYPIONATE 200 MG/ML IM SOLN
200.0000 mg | Freq: Once | INTRAMUSCULAR | Status: AC
  Administered 2019-01-21: 08:00:00 200 mg via INTRAMUSCULAR

## 2019-01-21 NOTE — Progress Notes (Signed)
Established Patient Office Visit  Subjective:  Patient ID: Shane Williams, male    DOB: [birth year]  Age: 48 y.o. MRN: [id number]  CC:  Chief Complaint  Patient presents with  . Hypogonadism    HPI Shane Williams is here for a testosterone injection. Denies chest pain, shortness of breath, headaches or mood changes.   Past Medical History:  Diagnosis Date  . Low testosterone 11/27/2012  . Type 2 diabetes mellitus (HCC) 11/27/2012    Past Surgical History:  Procedure Laterality Date  . APPENDECTOMY    . INCISE AND DRAIN ABCESS     4 times in last 2 years  . SHOULDER ARTHROSCOPY      Family History  Problem Relation Age of Onset  . Diabetes Father     Social History   Socioeconomic History  . Marital status: Married    Spouse name: Not on file  . Number of children: Not on file  . Years of education: Not on file  . Highest education level: Not on file  Occupational History  . Not on file  Social Needs  . Financial resource strain: Not on file  . Food insecurity:    Worry: Not on file    Inability: Not on file  . Transportation needs:    Medical: Not on file    Non-medical: Not on file  Tobacco Use  . Smoking status: Current Every Day Smoker    Packs/day: 0.75    Last attempt to quit: 05/12/2017    Years since quitting: 1.6  . Smokeless tobacco: Never Used  Substance and Sexual Activity  . Alcohol use: Yes  . Drug use: No  . Sexual activity: Not on file  Lifestyle  . Physical activity:    Days per week: Not on file    Minutes per session: Not on file  . Stress: Not on file  Relationships  . Social connections:    Talks on phone: Not on file    Gets together: Not on file    Attends religious service: Not on file    Active member of club or organization: Not on file    Attends meetings of clubs or organizations: Not on file    Relationship status: Not on file  . Intimate partner violence:    Fear of current or ex partner: Not on file    Emotionally  abused: Not on file    Physically abused: Not on file    Forced sexual activity: Not on file  Other Topics Concern  . Not on file  Social History Narrative  . Not on file    Outpatient Medications Prior to Visit  Medication Sig Dispense Refill  . AMBULATORY NON FORMULARY MEDICATION One Touch Ultra Blue Test Strips.  Diagnosis: Diabetes Mellitus. Test 2 times a day 100 strip 4  . aspirin EC 81 MG tablet Take 1 tablet (81 mg total) by mouth daily. 90 tablet 3  . atorvastatin (LIPITOR) 40 MG tablet TAKE 1 TABLET BY MOUTH EVERY DAY 90 tablet 0  . Dapagliflozin-metFORMIN HCl ER (XIGDUO XR) 07-999 MG TB24 Take 1 tablet by mouth daily. 90 tablet 1  . Dulaglutide (TRULICITY) 0.75 MG/0.5ML SOPN Inject 0.5 mLs into the skin once a week. 4 pen 11  . Lancets (ONETOUCH ULTRASOFT) lancets Use as directed 60 each 5  . metroNIDAZOLE (METROGEL) 1 % gel Apply topically 2 (two) times daily. 60 g 1  . tadalafil (ADCIRCA/CIALIS) 20 MG tablet Take 0.5-1 tablets (10-20 mg total) by  mouth daily as needed for erectile dysfunction. 30 tablet 12  . terbinafine (LAMISIL) 250 MG tablet Take 1 tablet (250 mg total) by mouth daily. 90 tablet 0   No facility-administered medications prior to visit.     Allergies  Allergen Reactions  . Penicillins Swelling  . Viagra [Sildenafil Citrate]     headache    ROS Review of Systems    Objective:    Physical Exam  BP 123/70   Pulse 81   SpO2 100%  Wt Readings from Last 3 Encounters:  12/08/18 228 lb (103.4 kg)  11/23/18 232 lb (105.2 kg)  11/09/18 231 lb (104.8 kg)     Health Maintenance Due  Topic Date Due  . OPHTHALMOLOGY EXAM  02/10/2018  . URINE MICROALBUMIN  02/06/2019    There are no preventive care reminders to display for this patient.  No results found for: TSH Lab Results  Component Value Date   WBC 6.4 10/26/2018   HGB 19.3 (H) 10/26/2018   HCT 56.7 (H) 10/26/2018   MCV 86.0 10/26/2018   PLT 218 10/26/2018   Lab Results   Component Value Date   NA 139 10/26/2018   K 4.2 10/26/2018   CO2 29 10/26/2018   GLUCOSE 134 (H) 10/26/2018   BUN 14 10/26/2018   CREATININE 1.06 10/26/2018   BILITOT 1.0 10/26/2018   ALKPHOS 68 01/15/2017   AST 26 10/26/2018   ALT 43 10/26/2018   PROT 7.0 10/26/2018   ALBUMIN 3.9 01/15/2017   CALCIUM 9.4 10/26/2018   Lab Results  Component Value Date   CHOL 141 10/26/2018   Lab Results  Component Value Date   HDL 33 (L) 10/26/2018   Lab Results  Component Value Date   LDLCALC 81 10/26/2018   Lab Results  Component Value Date   TRIG 168 (H) 10/26/2018   Lab Results  Component Value Date   CHOLHDL 4.3 10/26/2018   Lab Results  Component Value Date   HGBA1C 7.0 (H) 10/26/2018      Assessment & Plan:  Hypogonadism- Patient tolerated injection well without complications. Patient advised to schedule next injection 14 days from today. Patient advised he was given 200 mg of testosterone instead of his normal dose of 100 mg. He was advised to continue on the normal 100 mg dose next injection.     Problem List Items Addressed This Visit    None    Visit Diagnoses    Male hypogonadism    -  Primary   Relevant Medications   testosterone cypionate (DEPOTESTOSTERONE CYPIONATE) injection 200 mg (Completed)      Meds ordered this encounter  Medications  . testosterone cypionate (DEPOTESTOSTERONE CYPIONATE) injection 200 mg    Follow-up: Return in about 2 weeks (around 02/04/2019).    Esmond Harps, CMA

## 2019-01-25 ENCOUNTER — Encounter: Payer: Self-pay | Admitting: Family Medicine

## 2019-01-25 ENCOUNTER — Ambulatory Visit: Payer: BLUE CROSS/BLUE SHIELD | Admitting: Family Medicine

## 2019-01-25 ENCOUNTER — Telehealth: Payer: Self-pay | Admitting: Family Medicine

## 2019-01-25 ENCOUNTER — Other Ambulatory Visit: Payer: Self-pay

## 2019-01-25 ENCOUNTER — Telehealth (INDEPENDENT_AMBULATORY_CARE_PROVIDER_SITE_OTHER): Payer: BLUE CROSS/BLUE SHIELD | Admitting: Family Medicine

## 2019-01-25 DIAGNOSIS — R7989 Other specified abnormal findings of blood chemistry: Secondary | ICD-10-CM | POA: Diagnosis not present

## 2019-01-25 DIAGNOSIS — E1169 Type 2 diabetes mellitus with other specified complication: Secondary | ICD-10-CM

## 2019-01-25 DIAGNOSIS — B351 Tinea unguium: Secondary | ICD-10-CM

## 2019-01-25 DIAGNOSIS — E785 Hyperlipidemia, unspecified: Secondary | ICD-10-CM

## 2019-01-25 DIAGNOSIS — Z5181 Encounter for therapeutic drug level monitoring: Secondary | ICD-10-CM

## 2019-01-25 DIAGNOSIS — D582 Other hemoglobinopathies: Secondary | ICD-10-CM | POA: Diagnosis not present

## 2019-01-25 MED ORDER — TERBINAFINE HCL 250 MG PO TABS: 250.0000 mg | ORAL_TABLET | Freq: Every day | ORAL | 0 refills | Status: DC

## 2019-01-25 NOTE — Telephone Encounter (Signed)
-----   Message from Rodolph Bong, MD sent at 01/25/2019  8:03 AM EDT ----- Regarding: 3 month follow up Please schedule patient for a 3 month follow up for Diabetes.

## 2019-01-25 NOTE — Patient Instructions (Signed)
Thank you for coming in today.  Continue current medicines.  Continue exercises as able.  Get labs during the next visit for testosterone.  Recheck with me in 3 months or sooner if needed.   Keep the carbs low.

## 2019-01-25 NOTE — Progress Notes (Addendum)
Virtual Visit  via Video Note  I connected with      Shane Williams by a video enabled telemedicine application and verified that I am speaking with the correct person using two identifiers.   I discussed the limitations of evaluation and management by telemedicine and the availability of in person appointments. The patient expressed understanding and agreed to proceed.  History of Present Illness: Shane Williams is a 48 y.o. male who would like to discuss diabetes onychomycosis lipids and testosterone    DM: BS have been 80s and well controlled.  Shane Williams takes medications listed below and feels well.  Onychomycosis: Currently taking terbinafine.Shane Williams notes that his nails are growing out normally and he feels well with how things are going.  Hyperlipidemia: Tolerating atorvastatin quite well with no issues.  Testosterone: Currently receiving testosterone injections.  He feels as though it helps and he is happy with how things are going.     Observations/Objective: Wt Readings from Last 5 Encounters:  12/08/18 228 lb (103.4 kg)  11/23/18 232 lb (105.2 kg)  11/09/18 231 lb (104.8 kg)  09/30/18 231 lb (104.8 kg)  09/16/18 229 lb (103.9 kg)   Exam: Appearance non-toxic appearing no acute distress. Normal Speech.  Shortness of breath tachypnea wheezing or hoarseness. Normal facial appearance with no flushing or pallor.    Assessment and Plan: 48 y.o. male with  Diabetes: Doing reasonably well.  Plan to recheck A1c in the near future.  Continue current regimen.  Recommend exercise regularly.  Diabetic eye exam and urine microalbumin delayed.  Will get urine microalbumin during next in person visit for testosterone injection.  Onychomycosis: Doing well.  Patient will send a picture of his toes to add to the chart.  Check metabolic panel and continue current regimen.  Hyperlipidemia doing well cholesterol checked recently and normal.  Low testosterone: Currently receiving  testosterone injections.  Recheck hemoglobin given history of hemoglobin. Orders Placed This Encounter  Procedures  . CBC  . COMPLETE METABOLIC PANEL WITH GFR  . Hemoglobin A1c   Meds ordered this encounter  Medications  . terbinafine (LAMISIL) 250 MG tablet    Sig: Take 1 tablet (250 mg total) by mouth daily.    Dispense:  90 tablet    Refill:  0    Follow Up Instructions:    I discussed the assessment and treatment plan with the patient. The patient was provided an opportunity to ask questions and all were answered. The patient agreed with the plan and demonstrated an understanding of the instructions.   The patient was advised to call back or seek an in-person evaluation if the symptoms worsen or if the condition fails to improve as anticipated.  I provided 25 minutes of non-face-to-face time during this encounter.    Historical information moved to improve visibility of documentation.  Past Medical History:  Diagnosis Date  . Low testosterone 11/27/2012  . Type 2 diabetes mellitus (HCC) 11/27/2012   Past Surgical History:  Procedure Laterality Date  . APPENDECTOMY    . INCISE AND DRAIN ABCESS     4 times in last 2 years  . SHOULDER ARTHROSCOPY     Social History   Tobacco Use  . Smoking status: Current Every Day Smoker    Packs/day: 0.75    Years: 20.00    Pack years: 15.00    Last attempt to quit: 05/12/2017    Years since quitting: 1.7  . Smokeless tobacco: Never Used  Substance Use Topics  .  Alcohol use: Not Currently   family history includes Diabetes in his father.  Medications: Current Outpatient Medications  Medication Sig Dispense Refill  . AMBULATORY NON FORMULARY MEDICATION One Touch Ultra Blue Test Strips.  Diagnosis: Diabetes Mellitus. Test 2 times a day 100 strip 4  . aspirin EC 81 MG tablet Take 1 tablet (81 mg total) by mouth daily. 90 tablet 3  . atorvastatin (LIPITOR) 40 MG tablet TAKE 1 TABLET BY MOUTH EVERY DAY 90 tablet 0  .  Dapagliflozin-metFORMIN HCl ER (XIGDUO XR) 07-999 MG TB24 Take 1 tablet by mouth daily. 90 tablet 1  . Dulaglutide (TRULICITY) 0.75 MG/0.5ML SOPN Inject 0.5 mLs into the skin once a week. 4 pen 11  . Lancets (ONETOUCH ULTRASOFT) lancets Use as directed 60 each 5  . terbinafine (LAMISIL) 250 MG tablet Take 1 tablet (250 mg total) by mouth daily. 90 tablet 0   No current facility-administered medications for this visit.    Allergies  Allergen Reactions  . Penicillins Swelling  . Viagra [Sildenafil Citrate]     headache      Addendum to correct incorrect date due to templating error

## 2019-01-25 NOTE — Telephone Encounter (Signed)
Appointment made for a diabetes follow up for July. No further questions at this time.

## 2019-01-28 ENCOUNTER — Encounter: Payer: Self-pay | Admitting: Family Medicine

## 2019-02-04 ENCOUNTER — Ambulatory Visit (INDEPENDENT_AMBULATORY_CARE_PROVIDER_SITE_OTHER): Payer: BLUE CROSS/BLUE SHIELD | Admitting: Family Medicine

## 2019-02-04 VITALS — BP 114/70 | HR 85

## 2019-02-04 DIAGNOSIS — E291 Testicular hypofunction: Secondary | ICD-10-CM

## 2019-02-04 MED ORDER — TESTOSTERONE CYPIONATE 200 MG/ML IM SOLN
100.0000 mg | Freq: Once | INTRAMUSCULAR | Status: AC
  Administered 2019-02-04: 100 mg via INTRAMUSCULAR

## 2019-02-04 NOTE — Progress Notes (Signed)
Established Patient Office Visit  Subjective:  Patient ID: Shane Williams, male    DOB: [birth year]  Age: 48 y.o. MRN: [id number]  CC:  Chief Complaint  Patient presents with  . Hypogonadism    HPI Shane Williams is here for a testosterone injection. Denies chest pain, shortness of breath, headaches or mood changes.   Past Medical History:  Diagnosis Date  . Low testosterone 11/27/2012  . Type 2 diabetes mellitus (HCC) 11/27/2012    Past Surgical History:  Procedure Laterality Date  . APPENDECTOMY    . INCISE AND DRAIN ABCESS     4 times in last 2 years  . SHOULDER ARTHROSCOPY      Family History  Problem Relation Age of Onset  . Diabetes Father     Social History   Socioeconomic History  . Marital status: Married    Spouse name: Not on file  . Number of children: Not on file  . Years of education: Not on file  . Highest education level: Not on file  Occupational History  . Not on file  Social Needs  . Financial resource strain: Not on file  . Food insecurity:    Worry: Not on file    Inability: Not on file  . Transportation needs:    Medical: Not on file    Non-medical: Not on file  Tobacco Use  . Smoking status: Current Every Day Smoker    Packs/day: 0.75    Years: 20.00    Pack years: 15.00    Last attempt to quit: 05/12/2017    Years since quitting: 1.7  . Smokeless tobacco: Never Used  Substance and Sexual Activity  . Alcohol use: Not Currently  . Drug use: No  . Sexual activity: Not on file  Lifestyle  . Physical activity:    Days per week: Not on file    Minutes per session: Not on file  . Stress: Not on file  Relationships  . Social connections:    Talks on phone: Not on file    Gets together: Not on file    Attends religious service: Not on file    Active member of club or organization: Not on file    Attends meetings of clubs or organizations: Not on file    Relationship status: Not on file  . Intimate partner violence:    Fear of current  or ex partner: Not on file    Emotionally abused: Not on file    Physically abused: Not on file    Forced sexual activity: Not on file  Other Topics Concern  . Not on file  Social History Narrative  . Not on file    Outpatient Medications Prior to Visit  Medication Sig Dispense Refill  . AMBULATORY NON FORMULARY MEDICATION One Touch Ultra Blue Test Strips.  Diagnosis: Diabetes Mellitus. Test 2 times a day 100 strip 4  . aspirin EC 81 MG tablet Take 1 tablet (81 mg total) by mouth daily. 90 tablet 3  . atorvastatin (LIPITOR) 40 MG tablet TAKE 1 TABLET BY MOUTH EVERY DAY 90 tablet 0  . Dapagliflozin-metFORMIN HCl ER (XIGDUO XR) 07-999 MG TB24 Take 1 tablet by mouth daily. 90 tablet 1  . Dulaglutide (TRULICITY) 0.75 MG/0.5ML SOPN Inject 0.5 mLs into the skin once a week. 4 pen 11  . Lancets (ONETOUCH ULTRASOFT) lancets Use as directed 60 each 5  . terbinafine (LAMISIL) 250 MG tablet Take 1 tablet (250 mg total) by mouth daily. 90 tablet  0   No facility-administered medications prior to visit.     Allergies  Allergen Reactions  . Penicillins Swelling  . Viagra [Sildenafil Citrate]     headache    ROS Review of Systems    Objective:    Physical Exam  BP 114/70   Pulse 85   SpO2 98%  Wt Readings from Last 3 Encounters:  12/08/18 228 lb (103.4 kg)  11/23/18 232 lb (105.2 kg)  11/09/18 231 lb (104.8 kg)     Health Maintenance Due  Topic Date Due  . URINE MICROALBUMIN  02/06/2019    There are no preventive care reminders to display for this patient.  No results found for: TSH Lab Results  Component Value Date   WBC 6.4 10/26/2018   HGB 19.3 (H) 10/26/2018   HCT 56.7 (H) 10/26/2018   MCV 86.0 10/26/2018   PLT 218 10/26/2018   Lab Results  Component Value Date   NA 139 10/26/2018   K 4.2 10/26/2018   CO2 29 10/26/2018   GLUCOSE 134 (H) 10/26/2018   BUN 14 10/26/2018   CREATININE 1.06 10/26/2018   BILITOT 1.0 10/26/2018   ALKPHOS 68 01/15/2017   AST 26  10/26/2018   ALT 43 10/26/2018   PROT 7.0 10/26/2018   ALBUMIN 3.9 01/15/2017   CALCIUM 9.4 10/26/2018   Lab Results  Component Value Date   CHOL 141 10/26/2018   Lab Results  Component Value Date   HDL 33 (L) 10/26/2018   Lab Results  Component Value Date   LDLCALC 81 10/26/2018   Lab Results  Component Value Date   TRIG 168 (H) 10/26/2018   Lab Results  Component Value Date   CHOLHDL 4.3 10/26/2018   Lab Results  Component Value Date   HGBA1C 7.0 (H) 10/26/2018      Assessment & Plan:  Hypogonadism - Patient tolerated injection well without complications. Patient advised to schedule next injection 14 days from today.    Problem List Items Addressed This Visit    None    Visit Diagnoses    Male hypogonadism    -  Primary   Relevant Medications   testosterone cypionate (DEPOTESTOSTERONE CYPIONATE) injection 100 mg (Completed) (Start on 02/04/2019  9:00 AM)      Meds ordered this encounter  Medications  . testosterone cypionate (DEPOTESTOSTERONE CYPIONATE) injection 100 mg    Follow-up: Return in about 2 weeks (around 02/18/2019) for testosterone injection. Earna Coder.    [place], Janalyn HarderAngela Hale, CMA

## 2019-02-14 ENCOUNTER — Other Ambulatory Visit: Payer: Self-pay | Admitting: Family Medicine

## 2019-02-18 ENCOUNTER — Ambulatory Visit (INDEPENDENT_AMBULATORY_CARE_PROVIDER_SITE_OTHER): Payer: BLUE CROSS/BLUE SHIELD | Admitting: Family Medicine

## 2019-02-18 VITALS — BP 118/68 | HR 88

## 2019-02-18 DIAGNOSIS — E291 Testicular hypofunction: Secondary | ICD-10-CM | POA: Diagnosis not present

## 2019-02-18 MED ORDER — TESTOSTERONE CYPIONATE 200 MG/ML IM SOLN
100.0000 mg | Freq: Once | INTRAMUSCULAR | Status: AC
  Administered 2019-02-18: 100 mg via INTRAMUSCULAR

## 2019-02-18 NOTE — Progress Notes (Signed)
Established Patient Office Visit  Subjective:  Patient ID: Shane Williams, male    DOB: 1971-07-02  Age: 48 y.o. MRN: [id number]  CC:  Chief Complaint  Patient presents with  . Hypogonadism    HPI Shane Williams is here for a testosterone injection. Denies chest pain, shortness of breath, headaches or mood changes.   Past Medical History:  Diagnosis Date  . Low testosterone 11/27/2012  . Type 2 diabetes mellitus (HCC) 11/27/2012    Past Surgical History:  Procedure Laterality Date  . APPENDECTOMY    . INCISE AND DRAIN ABCESS     4 times in last 2 years  . SHOULDER ARTHROSCOPY      Family History  Problem Relation Age of Onset  . Diabetes Father     Social History   Socioeconomic History  . Marital status: Married    Spouse name: Not on file  . Number of children: Not on file  . Years of education: Not on file  . Highest education level: Not on file  Occupational History  . Not on file  Social Needs  . Financial resource strain: Not on file  . Food insecurity:    Worry: Not on file    Inability: Not on file  . Transportation needs:    Medical: Not on file    Non-medical: Not on file  Tobacco Use  . Smoking status: Current Every Day Smoker    Packs/day: 0.75    Years: 20.00    Pack years: 15.00    Last attempt to quit: 05/12/2017    Years since quitting: 1.7  . Smokeless tobacco: Never Used  Substance and Sexual Activity  . Alcohol use: Not Currently  . Drug use: No  . Sexual activity: Not on file  Lifestyle  . Physical activity:    Days per week: Not on file    Minutes per session: Not on file  . Stress: Not on file  Relationships  . Social connections:    Talks on phone: Not on file    Gets together: Not on file    Attends religious service: Not on file    Active member of club or organization: Not on file    Attends meetings of clubs or organizations: Not on file    Relationship status: Not on file  . Intimate partner violence:    Fear of current  or ex partner: Not on file    Emotionally abused: Not on file    Physically abused: Not on file    Forced sexual activity: Not on file  Other Topics Concern  . Not on file  Social History Narrative  . Not on file    Outpatient Medications Prior to Visit  Medication Sig Dispense Refill  . AMBULATORY NON FORMULARY MEDICATION One Touch Ultra Blue Test Strips.  Diagnosis: Diabetes Mellitus. Test 2 times a day 100 strip 4  . aspirin EC 81 MG tablet Take 1 tablet (81 mg total) by mouth daily. 90 tablet 3  . atorvastatin (LIPITOR) 40 MG tablet TAKE 1 TABLET BY MOUTH EVERY DAY 30 tablet 2  . Dapagliflozin-metFORMIN HCl ER (XIGDUO XR) 07-999 MG TB24 Take 1 tablet by mouth daily. 90 tablet 1  . Dulaglutide (TRULICITY) 0.75 MG/0.5ML SOPN Inject 0.5 mLs into the skin once a week. 4 pen 11  . Lancets (ONETOUCH ULTRASOFT) lancets Use as directed 60 each 5  . terbinafine (LAMISIL) 250 MG tablet Take 1 tablet (250 mg total) by mouth daily. 90 tablet  0   No facility-administered medications prior to visit.     Allergies  Allergen Reactions  . Penicillins Swelling  . Viagra [Sildenafil Citrate]     headache    ROS Review of Systems    Objective:    Physical Exam  BP 118/68   Pulse 88   SpO2 99%  Wt Readings from Last 3 Encounters:  12/08/18 228 lb (103.4 kg)  11/23/18 232 lb (105.2 kg)  11/09/18 231 lb (104.8 kg)     Health Maintenance Due  Topic Date Due  . URINE MICROALBUMIN  02/06/2019    There are no preventive care reminders to display for this patient.  No results found for: TSH Lab Results  Component Value Date   WBC 6.4 10/26/2018   HGB 19.3 (H) 10/26/2018   HCT 56.7 (H) 10/26/2018   MCV 86.0 10/26/2018   PLT 218 10/26/2018   Lab Results  Component Value Date   NA 139 10/26/2018   K 4.2 10/26/2018   CO2 29 10/26/2018   GLUCOSE 134 (H) 10/26/2018   BUN 14 10/26/2018   CREATININE 1.06 10/26/2018   BILITOT 1.0 10/26/2018   ALKPHOS 68 01/15/2017   AST 26  10/26/2018   ALT 43 10/26/2018   PROT 7.0 10/26/2018   ALBUMIN 3.9 01/15/2017   CALCIUM 9.4 10/26/2018   Lab Results  Component Value Date   CHOL 141 10/26/2018   Lab Results  Component Value Date   HDL 33 (L) 10/26/2018   Lab Results  Component Value Date   LDLCALC 81 10/26/2018   Lab Results  Component Value Date   TRIG 168 (H) 10/26/2018   Lab Results  Component Value Date   CHOLHDL 4.3 10/26/2018   Lab Results  Component Value Date   HGBA1C 7.0 (H) 10/26/2018      Assessment & Plan:  Hypogonadism - Patient tolerated injection well without complications. Patient advised to schedule next injection 14 days from today.    Problem List Items Addressed This Visit    None    Visit Diagnoses    Male hypogonadism    -  Primary   Relevant Medications   testosterone cypionate (DEPOTESTOSTERONE CYPIONATE) injection 100 mg (Completed)      Meds ordered this encounter  Medications  . testosterone cypionate (DEPOTESTOSTERONE CYPIONATE) injection 100 mg    Follow-up: Return in about 2 weeks (around 03/04/2019) for testosterone injection. Earna Coder.    Mistee Soliman, Janalyn HarderAngela Hale, CMA

## 2019-03-04 ENCOUNTER — Ambulatory Visit (INDEPENDENT_AMBULATORY_CARE_PROVIDER_SITE_OTHER): Payer: BLUE CROSS/BLUE SHIELD | Admitting: Family Medicine

## 2019-03-04 VITALS — BP 117/80 | HR 105 | Ht 72.0 in | Wt 228.0 lb

## 2019-03-04 DIAGNOSIS — R7989 Other specified abnormal findings of blood chemistry: Secondary | ICD-10-CM

## 2019-03-04 MED ORDER — TESTOSTERONE CYPIONATE 200 MG/ML IM SOLN
100.0000 mg | Freq: Once | INTRAMUSCULAR | Status: AC
  Administered 2019-03-04: 100 mg via INTRAMUSCULAR

## 2019-03-04 NOTE — Progress Notes (Signed)
   Subjective:    Patient ID: Shane Williams, male    DOB: 10-Aug-1971, 48 y.o.   MRN: [id number]  HPI Patient is here for a testosterone injection. Denies chest pain, shortness of breath, headaches, problems with mood change, or medication problems.   Review of Systems     Objective:   Physical Exam        Assessment & Plan:  Patient tolerated injection in [facility] well without complications. Patient advised to schedule his next injection for 2 weeks from today.

## 2019-03-16 ENCOUNTER — Other Ambulatory Visit: Payer: Self-pay | Admitting: Family Medicine

## 2019-03-18 ENCOUNTER — Ambulatory Visit (INDEPENDENT_AMBULATORY_CARE_PROVIDER_SITE_OTHER): Payer: BLUE CROSS/BLUE SHIELD | Admitting: Family Medicine

## 2019-03-18 VITALS — BP 131/68 | HR 95

## 2019-03-18 DIAGNOSIS — E291 Testicular hypofunction: Secondary | ICD-10-CM | POA: Diagnosis not present

## 2019-03-18 MED ORDER — TESTOSTERONE CYPIONATE 200 MG/ML IM SOLN
100.0000 mg | Freq: Once | INTRAMUSCULAR | Status: AC
  Administered 2019-03-18: 100 mg via INTRAMUSCULAR

## 2019-03-18 NOTE — Progress Notes (Signed)
Established Patient Office Visit  Subjective:  Patient ID: Shane Williams, male    DOB: 1971-07-23  Age: 48 y.o. MRN: [id number]  CC:  Chief Complaint  Patient presents with  . Hypogonadism    HPI Shane Williams is here for a testosterone injection. Denies chest pain, shortness of breath, headaches or mood changes.   Past Medical History:  Diagnosis Date  . Low testosterone 11/27/2012  . Type 2 diabetes mellitus ([facility]) 11/27/2012    Past Surgical History:  Procedure Laterality Date  . APPENDECTOMY    . INCISE AND DRAIN ABCESS     4 times in last 2 years  . SHOULDER ARTHROSCOPY      Family History  Problem Relation Age of Onset  . Diabetes Father     Social History   Socioeconomic History  . Marital status: Married    Spouse name: Not on file  . Number of children: Not on file  . Years of education: Not on file  . Highest education level: Not on file  Occupational History  . Not on file  Social Needs  . Financial resource strain: Not on file  . Food insecurity:    Worry: Not on file    Inability: Not on file  . Transportation needs:    Medical: Not on file    Non-medical: Not on file  Tobacco Use  . Smoking status: Current Every Day Smoker    Packs/day: 0.75    Years: 20.00    Pack years: 15.00    Last attempt to quit: 05/12/2017    Years since quitting: 1.8  . Smokeless tobacco: Never Used  Substance and Sexual Activity  . Alcohol use: Not Currently  . Drug use: No  . Sexual activity: Not on file  Lifestyle  . Physical activity:    Days per week: Not on file    Minutes per session: Not on file  . Stress: Not on file  Relationships  . Social connections:    Talks on phone: Not on file    Gets together: Not on file    Attends religious service: Not on file    Active member of club or organization: Not on file    Attends meetings of clubs or organizations: Not on file    Relationship status: Not on file  . Intimate partner violence:    Fear of current  or ex partner: Not on file    Emotionally abused: Not on file    Physically abused: Not on file    Forced sexual activity: Not on file  Other Topics Concern  . Not on file  Social History Narrative  . Not on file    Outpatient Medications Prior to Visit  Medication Sig Dispense Refill  . AMBULATORY NON FORMULARY MEDICATION One Touch Ultra Blue Test Strips.  Diagnosis: Diabetes Mellitus. Test 2 times a day 100 strip 4  . aspirin EC 81 MG tablet Take 1 tablet (81 mg total) by mouth daily. 90 tablet 3  . atorvastatin (LIPITOR) 40 MG tablet TAKE 1 TABLET BY MOUTH EVERY DAY 30 tablet 2  . Dapagliflozin-metFORMIN HCl ER (XIGDUO XR) 07-999 MG TB24 Take 1 tablet by mouth daily. 90 tablet 1  . Lancets (ONETOUCH ULTRASOFT) lancets Use as directed 60 each 5  . terbinafine (LAMISIL) 250 MG tablet Take 1 tablet (250 mg total) by mouth daily. 90 tablet 0  . TRULICITY 0.75 MG/0.5ML SOPN INJECT 0.5 MLS INTO THE SKIN ONCE A WEEK. 4 pen 11  No facility-administered medications prior to visit.     Allergies  Allergen Reactions  . Penicillins Swelling  . Viagra [Sildenafil Citrate]     headache    ROS Review of Systems    Objective:    Physical Exam  BP 131/68   Pulse 95   SpO2 98%  Wt Readings from Last 3 Encounters:  03/04/19 228 lb (103.4 kg)  12/08/18 228 lb (103.4 kg)  11/23/18 232 lb (105.2 kg)     Health Maintenance Due  Topic Date Due  . URINE MICROALBUMIN  02/06/2019    There are no preventive care reminders to display for this patient.  No results found for: TSH Lab Results  Component Value Date   WBC 6.4 10/26/2018   HGB 19.3 (H) 10/26/2018   HCT 56.7 (H) 10/26/2018   MCV 86.0 10/26/2018   PLT 218 10/26/2018   Lab Results  Component Value Date   NA 139 10/26/2018   K 4.2 10/26/2018   CO2 29 10/26/2018   GLUCOSE 134 (H) 10/26/2018   BUN 14 10/26/2018   CREATININE 1.06 10/26/2018   BILITOT 1.0 10/26/2018   ALKPHOS 68 01/15/2017   AST 26 10/26/2018    ALT 43 10/26/2018   PROT 7.0 10/26/2018   ALBUMIN 3.9 01/15/2017   CALCIUM 9.4 10/26/2018   Lab Results  Component Value Date   CHOL 141 10/26/2018   Lab Results  Component Value Date   HDL 33 (L) 10/26/2018   Lab Results  Component Value Date   LDLCALC 81 10/26/2018   Lab Results  Component Value Date   TRIG 168 (H) 10/26/2018   Lab Results  Component Value Date   CHOLHDL 4.3 10/26/2018   Lab Results  Component Value Date   HGBA1C 7.0 (H) 10/26/2018      Assessment & Plan:  Hypogonadism - Patient tolerated injection well without complications. Patient advised to schedule next injection 14 days from today.    Problem List Items Addressed This Visit    None    Visit Diagnoses    Male hypogonadism    -  Primary   Relevant Medications   testosterone cypionate (DEPOTESTOSTERONE CYPIONATE) injection 100 mg (Completed) (Start on 03/18/2019  8:30 AM)      Meds ordered this encounter  Medications  . testosterone cypionate (DEPOTESTOSTERONE CYPIONATE) injection 100 mg    Follow-up: Return in about 2 weeks (around 04/01/2019) for testosterone injection.Earna Coder, Janalyn Harder, CMA

## 2019-04-02 ENCOUNTER — Ambulatory Visit (INDEPENDENT_AMBULATORY_CARE_PROVIDER_SITE_OTHER): Payer: BC Managed Care – PPO | Admitting: Family Medicine

## 2019-04-02 VITALS — BP 117/71 | HR 78

## 2019-04-02 DIAGNOSIS — E291 Testicular hypofunction: Secondary | ICD-10-CM | POA: Diagnosis not present

## 2019-04-02 MED ORDER — TESTOSTERONE CYPIONATE 200 MG/ML IM SOLN
100.0000 mg | Freq: Once | INTRAMUSCULAR | Status: AC
  Administered 2019-04-02: 100 mg via INTRAMUSCULAR

## 2019-04-02 NOTE — Progress Notes (Signed)
Established Patient Office Visit  Subjective:  Patient ID: Shane Williams, male    DOB: 1970/11/19  Age: 48 y.o. MRN: [id number]  CC:  Chief Complaint  Patient presents with  . Hypogonadism    HPI Shane Williams is here for a testosterone injection. Denies chest pain, shortness of breath, headaches or mood changes.   Past Medical History:  Diagnosis Date  . Low testosterone 11/27/2012  . Type 2 diabetes mellitus ([place]) 11/27/2012    Past Surgical History:  Procedure Laterality Date  . APPENDECTOMY    . INCISE AND DRAIN ABCESS     4 times in last 2 years  . SHOULDER ARTHROSCOPY      Family History  Problem Relation Age of Onset  . Diabetes Father     Social History   Socioeconomic History  . Marital status: Married    Spouse name: Not on file  . Number of children: Not on file  . Years of education: Not on file  . Highest education level: Not on file  Occupational History  . Not on file  Social Needs  . Financial resource strain: Not on file  . Food insecurity    Worry: Not on file    Inability: Not on file  . Transportation needs    Medical: Not on file    Non-medical: Not on file  Tobacco Use  . Smoking status: Current Every Day Smoker    Packs/day: 0.75    Years: 20.00    Pack years: 15.00    Last attempt to quit: 05/12/2017    Years since quitting: 1.8  . Smokeless tobacco: Never Used  Substance and Sexual Activity  . Alcohol use: Not Currently  . Drug use: No  . Sexual activity: Not on file  Lifestyle  . Physical activity    Days per week: Not on file    Minutes per session: Not on file  . Stress: Not on file  Relationships  . Social Herbalist on phone: Not on file    Gets together: Not on file    Attends religious service: Not on file    Active member of club or organization: Not on file    Attends meetings of clubs or organizations: Not on file    Relationship status: Not on file  . Intimate partner violence    Fear of current or  ex partner: Not on file    Emotionally abused: Not on file    Physically abused: Not on file    Forced sexual activity: Not on file  Other Topics Concern  . Not on file  Social History Narrative  . Not on file    Outpatient Medications Prior to Visit  Medication Sig Dispense Refill  . AMBULATORY NON FORMULARY MEDICATION One Touch Ultra Blue Test Strips.  Diagnosis: Diabetes Mellitus. Test 2 times a day 100 strip 4  . aspirin EC 81 MG tablet Take 1 tablet (81 mg total) by mouth daily. 90 tablet 3  . atorvastatin (LIPITOR) 40 MG tablet TAKE 1 TABLET BY MOUTH EVERY DAY 30 tablet 2  . Dapagliflozin-metFORMIN HCl ER (XIGDUO XR) 07-999 MG TB24 Take 1 tablet by mouth daily. 90 tablet 1  . Lancets (ONETOUCH ULTRASOFT) lancets Use as directed 60 each 5  . terbinafine (LAMISIL) 250 MG tablet Take 1 tablet (250 mg total) by mouth daily. 90 tablet 0  . TRULICITY 7.32 KG/2.5KY SOPN INJECT 0.5 MLS INTO THE SKIN ONCE A WEEK. 4 pen 11  No facility-administered medications prior to visit.     Allergies  Allergen Reactions  . Penicillins Swelling  . Viagra [Sildenafil Citrate]     headache    ROS Review of Systems    Objective:    Physical Exam  BP 117/71   Pulse 78   SpO2 98%  Wt Readings from Last 3 Encounters:  03/04/19 228 lb (103.4 kg)  12/08/18 228 lb (103.4 kg)  11/23/18 232 lb (105.2 kg)     Health Maintenance Due  Topic Date Due  . URINE MICROALBUMIN  02/06/2019    There are no preventive care reminders to display for this patient.  No results found for: TSH Lab Results  Component Value Date   WBC 6.4 10/26/2018   HGB 19.3 (H) 10/26/2018   HCT 56.7 (H) 10/26/2018   MCV 86.0 10/26/2018   PLT 218 10/26/2018   Lab Results  Component Value Date   NA 139 10/26/2018   K 4.2 10/26/2018   CO2 29 10/26/2018   GLUCOSE 134 (H) 10/26/2018   BUN 14 10/26/2018   CREATININE 1.06 10/26/2018   BILITOT 1.0 10/26/2018   ALKPHOS 68 01/15/2017   AST 26 10/26/2018   ALT  43 10/26/2018   PROT 7.0 10/26/2018   ALBUMIN 3.9 01/15/2017   CALCIUM 9.4 10/26/2018   Lab Results  Component Value Date   CHOL 141 10/26/2018   Lab Results  Component Value Date   HDL 33 (L) 10/26/2018   Lab Results  Component Value Date   LDLCALC 81 10/26/2018   Lab Results  Component Value Date   TRIG 168 (H) 10/26/2018   Lab Results  Component Value Date   CHOLHDL 4.3 10/26/2018   Lab Results  Component Value Date   HGBA1C 7.0 (H) 10/26/2018      Assessment & Plan:  Hypogonadism - Patient tolerated injection well without complications. Patient advised to schedule next injection 14 days from today.    Problem List Items Addressed This Visit    None    Visit Diagnoses    Male hypogonadism    -  Primary   Relevant Medications   testosterone cypionate (DEPOTESTOSTERONE CYPIONATE) injection 100 mg (Completed) (Start on 04/02/2019  8:45 AM)      Meds ordered this encounter  Medications  . testosterone cypionate (DEPOTESTOSTERONE CYPIONATE) injection 100 mg    Follow-up: Return in about 2 weeks (around 04/16/2019) for testosterone injection. Earna Coder.    Jaishaun Mcnab, Janalyn HarderAngela Hale, CMA

## 2019-04-15 ENCOUNTER — Ambulatory Visit (INDEPENDENT_AMBULATORY_CARE_PROVIDER_SITE_OTHER): Payer: BC Managed Care – PPO | Admitting: Family Medicine

## 2019-04-15 VITALS — BP 114/71 | HR 98 | Ht 72.0 in | Wt 230.0 lb

## 2019-04-15 DIAGNOSIS — R7989 Other specified abnormal findings of blood chemistry: Secondary | ICD-10-CM | POA: Diagnosis not present

## 2019-04-15 MED ORDER — TESTOSTERONE CYPIONATE 200 MG/ML IM SOLN
100.0000 mg | Freq: Once | INTRAMUSCULAR | Status: AC
  Administered 2019-04-15: 100 mg via INTRAMUSCULAR

## 2019-04-15 NOTE — Progress Notes (Signed)
   Subjective:    Patient ID: Shane Williams, male    DOB: 01-02-1971, 48 y.o.   MRN: [id number]  HPI Patient is here for a testosterone injection. Denies chest pain, shortness of breath, headaches, problems with mood change, or medication problems.   Review of Systems     Objective:   Physical Exam        Assessment & Plan:  Patient tolerated injection in [place] well without complications. Patient advised to schedule his next injection for 2 weeks from today.

## 2019-04-16 ENCOUNTER — Ambulatory Visit: Payer: BC Managed Care – PPO

## 2019-04-26 ENCOUNTER — Ambulatory Visit: Payer: BLUE CROSS/BLUE SHIELD | Admitting: Family Medicine

## 2019-04-29 ENCOUNTER — Other Ambulatory Visit: Payer: Self-pay

## 2019-04-29 ENCOUNTER — Ambulatory Visit (INDEPENDENT_AMBULATORY_CARE_PROVIDER_SITE_OTHER): Payer: BC Managed Care – PPO | Admitting: Family Medicine

## 2019-04-29 VITALS — BP 102/78 | HR 86 | Temp 98.5°F | Ht 72.0 in | Wt 231.0 lb

## 2019-04-29 DIAGNOSIS — E291 Testicular hypofunction: Secondary | ICD-10-CM | POA: Diagnosis not present

## 2019-04-29 DIAGNOSIS — R7989 Other specified abnormal findings of blood chemistry: Secondary | ICD-10-CM

## 2019-04-29 MED ORDER — TESTOSTERONE CYPIONATE 200 MG/ML IM SOLN
100.0000 mg | Freq: Once | INTRAMUSCULAR | Status: AC
  Administered 2019-04-29: 100 mg via INTRAMUSCULAR

## 2019-04-29 NOTE — Progress Notes (Signed)
Patient presents to clinic for Testosterone injection in [place]. He tolerated well with no immediate complications. Patient denies any chest pain, shortness of breath or mood changes while taking the medication. He did not have any other concerns during the visit. Patient was advised to schedule a follow up in 2 weeks.

## 2019-05-13 ENCOUNTER — Ambulatory Visit: Payer: BC Managed Care – PPO

## 2019-05-26 ENCOUNTER — Other Ambulatory Visit: Payer: Self-pay | Admitting: Family Medicine

## 2019-05-28 ENCOUNTER — Other Ambulatory Visit: Payer: Self-pay | Admitting: Family Medicine

## 2019-10-06 ENCOUNTER — Other Ambulatory Visit: Payer: Self-pay | Admitting: Family Medicine

## 2019-12-06 ENCOUNTER — Other Ambulatory Visit: Payer: Self-pay | Admitting: Family Medicine

## 2019-12-15 ENCOUNTER — Other Ambulatory Visit: Payer: Self-pay | Admitting: Family Medicine

## 2019-12-24 ENCOUNTER — Encounter: Payer: Self-pay | Admitting: Family Medicine

## 2019-12-24 ENCOUNTER — Other Ambulatory Visit: Payer: Self-pay

## 2019-12-24 ENCOUNTER — Ambulatory Visit (INDEPENDENT_AMBULATORY_CARE_PROVIDER_SITE_OTHER): Payer: BC Managed Care – PPO | Admitting: Family Medicine

## 2019-12-24 VITALS — BP 135/90 | HR 97 | Temp 98.0°F | Ht 72.0 in | Wt 223.0 lb

## 2019-12-24 DIAGNOSIS — E119 Type 2 diabetes mellitus without complications: Secondary | ICD-10-CM

## 2019-12-24 DIAGNOSIS — E785 Hyperlipidemia, unspecified: Secondary | ICD-10-CM

## 2019-12-24 DIAGNOSIS — R7989 Other specified abnormal findings of blood chemistry: Secondary | ICD-10-CM

## 2019-12-24 DIAGNOSIS — Z1211 Encounter for screening for malignant neoplasm of colon: Secondary | ICD-10-CM | POA: Diagnosis not present

## 2019-12-24 LAB — POCT GLYCOSYLATED HEMOGLOBIN (HGB A1C): Hemoglobin A1C: 10.2 % — AB (ref 4.0–5.6)

## 2019-12-24 MED ORDER — XIGDUO XR 10-1000 MG PO TB24: 1.0000 | ORAL_TABLET | Freq: Every day | ORAL | 2 refills | Status: DC

## 2019-12-24 MED ORDER — TRULICITY 0.75 MG/0.5ML ~~LOC~~ SOAJ: SUBCUTANEOUS | 2 refills | Status: DC

## 2019-12-24 MED ORDER — ATORVASTATIN CALCIUM 40 MG PO TABS: 40.0000 mg | ORAL_TABLET | Freq: Every day | ORAL | 2 refills | Status: DC

## 2019-12-24 NOTE — Assessment & Plan Note (Signed)
Most recent A1c of  Lab Results  Component Value Date   HGBA1C 10.2 (A) 12/24/2019   indicates diabetes is not well controlled.  He will work on improving diet and exercise and consistent use of medication.  Discussed if not improving by next visit we'll need to add insulin to get blood sugar under better control.  Counseled on healthy, low carb diet and recommend frequent activity to help with maintaining good control of blood sugars.

## 2019-12-24 NOTE — Patient Instructions (Addendum)
Great to meet you today! Please continue current medications be sure to use consistently.  Watch the carbohydrate intake. If A1c is not improving we may need to add some insulin to bring this down.  I will see you back in about 3 months.    Diabetes Mellitus and Nutrition, Adult When you have diabetes (diabetes mellitus), it is very important to have healthy eating habits because your blood sugar (glucose) levels are greatly affected by what you eat and drink. Eating healthy foods in the appropriate amounts, at about the same times every day, can help you:  Control your blood glucose.  Lower your risk of heart disease.  Improve your blood pressure.  Reach or maintain a healthy weight. Every person with diabetes is different, and each person has different needs for a meal plan. Your health care provider may recommend that you work with a diet and nutrition specialist (dietitian) to make a meal plan that is best for you. Your meal plan may vary depending on factors such as:  The calories you need.  The medicines you take.  Your weight.  Your blood glucose, blood pressure, and cholesterol levels.  Your activity level.  Other health conditions you have, such as heart or kidney disease. How do carbohydrates affect me? Carbohydrates, also called carbs, affect your blood glucose level more than any other type of food. Eating carbs naturally raises the amount of glucose in your blood. Carb counting is a method for keeping track of how many carbs you eat. Counting carbs is important to keep your blood glucose at a healthy level, especially if you use insulin or take certain oral diabetes medicines. It is important to know how many carbs you can safely have in each meal. This is different for every person. Your dietitian can help you calculate how many carbs you should have at each meal and for each snack. Foods that contain carbs include:  Bread, cereal, rice, pasta, and  crackers.  Potatoes and corn.  Peas, beans, and lentils.  Milk and yogurt.  Fruit and juice.  Desserts, such as cakes, cookies, ice cream, and candy. How does alcohol affect me? Alcohol can cause a sudden decrease in blood glucose (hypoglycemia), especially if you use insulin or take certain oral diabetes medicines. Hypoglycemia can be a life-threatening condition. Symptoms of hypoglycemia (sleepiness, dizziness, and confusion) are similar to symptoms of having too much alcohol. If your health care provider says that alcohol is safe for you, follow these guidelines:  Limit alcohol intake to no more than 1 drink per day for nonpregnant women and 2 drinks per day for men. One drink equals 12 oz of beer, 5 oz of wine, or 1 oz of hard liquor.  Do not drink on an empty stomach.  Keep yourself hydrated with water, diet soda, or unsweetened iced tea.  Keep in mind that regular soda, juice, and other mixers may contain a lot of sugar and must be counted as carbs. What are tips for following this plan?  Reading food labels  Start by checking the serving size on the "Nutrition Facts" label of packaged foods and drinks. The amount of calories, carbs, fats, and other nutrients listed on the label is based on one serving of the item. Many items contain more than one serving per package.  Check the total grams (g) of carbs in one serving. You can calculate the number of servings of carbs in one serving by dividing the total carbs by 15. For example, if  a food has 30 g of total carbs, it would be equal to 2 servings of carbs.  Check the number of grams (g) of saturated and trans fats in one serving. Choose foods that have low or no amount of these fats.  Check the number of milligrams (mg) of salt (sodium) in one serving. Most people should limit total sodium intake to less than 2,300 mg per day.  Always check the nutrition information of foods labeled as "low-fat" or "nonfat". These foods may be  higher in added sugar or refined carbs and should be avoided.  Talk to your dietitian to identify your daily goals for nutrients listed on the label. Shopping  Avoid buying canned, premade, or processed foods. These foods tend to be high in fat, sodium, and added sugar.  Shop around the outside edge of the grocery store. This includes fresh fruits and vegetables, bulk grains, fresh meats, and fresh dairy. Cooking  Use low-heat cooking methods, such as baking, instead of high-heat cooking methods like deep frying.  Cook using healthy oils, such as olive, canola, or sunflower oil.  Avoid cooking with butter, cream, or high-fat meats. Meal planning  Eat meals and snacks regularly, preferably at the same times every day. Avoid going long periods of time without eating.  Eat foods high in fiber, such as fresh fruits, vegetables, beans, and whole grains. Talk to your dietitian about how many servings of carbs you can eat at each meal.  Eat 4-6 ounces (oz) of lean protein each day, such as lean meat, chicken, fish, eggs, or tofu. One oz of lean protein is equal to: ? 1 oz of meat, chicken, or fish. ? 1 egg. ?  cup of tofu.  Eat some foods each day that contain healthy fats, such as avocado, nuts, seeds, and fish. Lifestyle  Check your blood glucose regularly.  Exercise regularly as told by your health care provider. This may include: ? 150 minutes of moderate-intensity or vigorous-intensity exercise each week. This could be brisk walking, biking, or water aerobics. ? Stretching and doing strength exercises, such as yoga or weightlifting, at least 2 times a week.  Take medicines as told by your health care provider.  Do not use any products that contain nicotine or tobacco, such as cigarettes and e-cigarettes. If you need help quitting, ask your health care provider.  Work with a Social worker or diabetes educator to identify strategies to manage stress and any emotional and social  challenges. Questions to ask a health care provider  Do I need to meet with a diabetes educator?  Do I need to meet with a dietitian?  What number can I call if I have questions?  When are the best times to check my blood glucose? Where to find more information:  American Diabetes Association: diabetes.org  Academy of Nutrition and Dietetics: www.eatright.[employer] of Diabetes and Digestive and Kidney Diseases (NIH): [employer] Summary  A healthy meal plan will help you control your blood glucose and maintain a healthy lifestyle.  Working with a diet and nutrition specialist (dietitian) can help you make a meal plan that is best for you.  Keep in mind that carbohydrates (carbs) and alcohol have immediate effects on your blood glucose levels. It is important to count carbs and to use alcohol carefully. This information is not intended to replace advice given to you by your health care provider. Make sure you discuss any questions you have with your health care provider. Document Revised: 09/19/2017 Document  Reviewed: 11/11/2016 Elsevier Patient Education  [employer].

## 2019-12-24 NOTE — Progress Notes (Signed)
Shane Williams - 49 y.o. male MRN [id number]  Date of birth: 1971/03/28  Subjective Chief Complaint  Patient presents with  . Diabetes    HPI Shane Williams is a 49 y.o. male with history of T2DM, HLD and hypogonadism here today for follow up.  It has been about 1 year since his last visit.  He reports he has been doing well.  He has no new concerns today.   -T2DM:  Current tx with trulicity and xigduo.  He reports he is doing well with these medications.  He is using as directed.  He does check blood sugars at home and states they are "normal".  He does not provide specific numbers.  He denies hypoglycemia.  He has started drinking a little more on the weekends which causes his blood sugars to go up.    -HLD:  Compliant with atorvastatin.  Denies side effects including myalgias.    -Hypogonadism:  Previously receiving testosterone injections.  Last injection was 04/2019.  He states that he does not feel any different not using this vs when he was having injections.   ROS:  A comprehensive ROS was completed and negative except as noted per HPI  Allergies  Allergen Reactions  . Penicillins Swelling  . Viagra [Sildenafil Citrate]     headache    Past Medical History:  Diagnosis Date  . Low testosterone 11/27/2012  . Type 2 diabetes mellitus ([place]) 11/27/2012    Past Surgical History:  Procedure Laterality Date  . APPENDECTOMY    . INCISE AND DRAIN ABCESS     4 times in last 2 years  . SHOULDER ARTHROSCOPY      Social History   Socioeconomic History  . Marital status: Married    Spouse name: Not on file  . Number of children: Not on file  . Years of education: Not on file  . Highest education level: Not on file  Occupational History  . Not on file  Tobacco Use  . Smoking status: Current Every Day Smoker    Packs/day: 0.75    Years: 20.00    Pack years: 15.00    Last attempt to quit: 05/12/2017    Years since quitting: 2.6  . Smokeless tobacco: Never Used  Substance and  Sexual Activity  . Alcohol use: Not Currently  . Drug use: No  . Sexual activity: Not on file  Other Topics Concern  . Not on file  Social History Narrative  . Not on file   Social Determinants of Health   Financial Resource Strain:   . Difficulty of Paying Living Expenses: Not on file  Food Insecurity:   . Worried About Charity fundraiser in the Last Year: Not on file  . Ran Out of Food in the Last Year: Not on file  Transportation Needs:   . Lack of Transportation (Medical): Not on file  . Lack of Transportation (Non-Medical): Not on file  Physical Activity:   . Days of Exercise per Week: Not on file  . Minutes of Exercise per Session: Not on file  Stress:   . Feeling of Stress : Not on file  Social Connections:   . Frequency of Communication with Friends and Family: Not on file  . Frequency of Social Gatherings with Friends and Family: Not on file  . Attends Religious Services: Not on file  . Active Member of Clubs or Organizations: Not on file  . Attends Archivist Meetings: Not on file  . Marital  Status: Not on file    Family History  Problem Relation Age of Onset  . Diabetes Father     Health Maintenance  Topic Date Due  . COLONOSCOPY  11/03/2019  . INFLUENZA VACCINE  01/19/2020 (Originally 05/22/2019)  . FOOT EXAM  12/23/2020 (Originally 10/27/2019)  . OPHTHALMOLOGY EXAM  12/25/2019  . HEMOGLOBIN A1C  06/25/2020  . URINE MICROALBUMIN  07/28/2020  . TETANUS/TDAP  08/06/2026  . PNEUMOCOCCAL POLYSACCHARIDE VACCINE AGE 40-64 HIGH RISK  Completed  . HIV Screening  Completed     ----------------------------------------------------------------------------------------------------------------------------------------------------------------------------------------------------------------- Physical Exam BP 135/90   Pulse 97   Temp 98 F (36.7 C) (Oral)   Ht 6' (1.829 m)   Wt 223 lb (101.2 kg)   BMI 30.24 kg/m   Physical Exam Constitutional:       Appearance: Normal appearance.  HENT:     Head: Normocephalic.  Eyes:     General: No scleral icterus. Cardiovascular:     Rate and Rhythm: Normal rate and regular rhythm.  Pulmonary:     Effort: Pulmonary effort is normal.     Breath sounds: Normal breath sounds.  Musculoskeletal:     Cervical back: Neck supple.  Skin:    General: Skin is warm.  Neurological:     General: No focal deficit present.     Mental Status: He is alert.  Psychiatric:        Mood and Affect: Mood normal.        Behavior: Behavior normal.     ------------------------------------------------------------------------------------------------------------------------------------------------------------------------------------------------------------------- Assessment and Plan  Low testosterone He has been off testosterone x8 moths, doesn't feel any different.  Will recheck levels and PSA today.   Hyperlipidemia LDL goal <100 Compliant with atorvastatin Update lipid panel today.   Type 2 diabetes mellitus without complication, without long-term current use of insulin (HCC) Most recent A1c of  Lab Results  Component Value Date   HGBA1C 10.2 (A) 12/24/2019   indicates diabetes is not well controlled.  He will work on improving diet and exercise and consistent use of medication.  Discussed if not improving by next visit we'll need to add insulin to get blood sugar under better control.  Counseled on healthy, low carb diet and recommend frequent activity to help with maintaining good control of blood sugars.     Meds ordered this encounter  Medications  . atorvastatin (LIPITOR) 40 MG tablet    Sig: Take 1 tablet (40 mg total) by mouth daily.    Dispense:  90 tablet    Refill:  2  . Dulaglutide (TRULICITY) 0.75 MG/0.5ML SOPN    Sig: INJECT 0.5 MLS INTO THE SKIN ONCE A WEEK.    Dispense:  12 pen    Refill:  2  . Dapagliflozin-metFORMIN HCl ER (XIGDUO XR) 07-999 MG TB24    Sig: Take 1 tablet by mouth  daily.    Dispense:  90 tablet    Refill:  2    Return in about 3 months (around 03/25/2020) for T2DM.    This visit occurred during the SARS-CoV-2 public health emergency.  Safety protocols were in place, including screening questions prior to the visit, additional usage of staff PPE, and extensive cleaning of exam room while observing appropriate contact time as indicated for disinfecting solutions.

## 2019-12-24 NOTE — Assessment & Plan Note (Signed)
He has been off testosterone x8 moths, doesn't feel any different.  Will recheck levels and PSA today.

## 2019-12-24 NOTE — Assessment & Plan Note (Signed)
Compliant with atorvastatin Update lipid panel today.

## 2019-12-25 LAB — COMPLETE METABOLIC PANEL WITH GFR
AG Ratio: 1.7 (calc) (ref 1.0–2.5)
ALT: 64 U/L — ABNORMAL HIGH (ref 9–46)
AST: 33 U/L (ref 10–35)
Albumin: 4.3 g/dL (ref 3.6–5.1)
Alkaline phosphatase (APISO): 85 U/L (ref 35–144)
BUN: 13 mg/dL (ref 7–25)
CO2: 25 mmol/L (ref 20–32)
Calcium: 10 mg/dL (ref 8.6–10.3)
Chloride: 101 mmol/L (ref 98–110)
Creat: 0.91 mg/dL (ref 0.70–1.33)
GFR, Est African American: 113 mL/min/{1.73_m2} (ref >=60)
GFR, Est Non African American: 98 mL/min/{1.73_m2} (ref >=60)
Globulin: 2.6 g/dL (calc) (ref 1.9–3.7)
Glucose, Bld: 169 mg/dL — ABNORMAL HIGH (ref 65–99)
Potassium: 4.1 mmol/L (ref 3.5–5.3)
Sodium: 137 mmol/L (ref 135–146)
Total Bilirubin: 1.1 mg/dL (ref 0.2–1.2)
Total Protein: 6.9 g/dL (ref 6.1–8.1)

## 2019-12-25 LAB — LIPID PANEL
Cholesterol: 115 mg/dL (ref <200)
HDL: 30 mg/dL — ABNORMAL LOW (ref >=40)
LDL Cholesterol (Calc): 64 mg/dL (calc)
Non-HDL Cholesterol (Calc): 85 mg/dL (calc) (ref <130)
Total CHOL/HDL Ratio: 3.8 (calc) (ref <5.0)
Triglycerides: 120 mg/dL (ref <150)

## 2019-12-25 LAB — TESTOSTERONE: Testosterone: 287 ng/dL (ref 250–827)

## 2019-12-25 LAB — PSA: PSA: 0.4 ng/mL

## 2019-12-27 ENCOUNTER — Other Ambulatory Visit: Payer: Self-pay | Admitting: Family Medicine

## 2019-12-27 NOTE — Telephone Encounter (Signed)
Shane Williams requesting med refill for tadalafil. Rx not listed in active med list. When responding send to [facility] CLINICAL POOL. Thanks.

## 2020-01-10 DIAGNOSIS — Z1211 Encounter for screening for malignant neoplasm of colon: Secondary | ICD-10-CM | POA: Diagnosis not present

## 2020-01-10 DIAGNOSIS — Z1212 Encounter for screening for malignant neoplasm of rectum: Secondary | ICD-10-CM | POA: Diagnosis not present

## 2020-01-13 LAB — COLOGUARD
COLOGUARD: NEGATIVE
Cologuard: NEGATIVE

## 2020-01-19 ENCOUNTER — Encounter: Payer: Self-pay | Admitting: Family Medicine

## 2020-02-08 ENCOUNTER — Other Ambulatory Visit: Payer: Self-pay | Admitting: Emergency Medicine

## 2020-02-08 DIAGNOSIS — M7542 Impingement syndrome of left shoulder: Secondary | ICD-10-CM

## 2020-02-13 ENCOUNTER — Other Ambulatory Visit: Payer: BC Managed Care – PPO

## 2020-03-27 ENCOUNTER — Ambulatory Visit: Payer: BC Managed Care – PPO | Admitting: Family Medicine

## 2020-11-04 ENCOUNTER — Other Ambulatory Visit: Payer: Self-pay | Admitting: Family Medicine

## 2020-11-08 ENCOUNTER — Other Ambulatory Visit: Payer: Self-pay

## 2020-11-08 MED ORDER — ATORVASTATIN CALCIUM 40 MG PO TABS: 40.0000 mg | ORAL_TABLET | Freq: Every day | ORAL | 0 refills | Status: DC

## 2020-12-09 ENCOUNTER — Other Ambulatory Visit: Payer: Self-pay | Admitting: Family Medicine

## 2020-12-26 ENCOUNTER — Other Ambulatory Visit: Payer: Self-pay | Admitting: Family Medicine

## 2020-12-26 ENCOUNTER — Telehealth: Payer: Self-pay

## 2020-12-26 NOTE — Telephone Encounter (Signed)
Please schedule patient for annual appt. Not seen since 12/2019. Sending 30 day refill on diabetic medications.

## 2020-12-26 NOTE — Telephone Encounter (Signed)
LVM on patients home phone number to call back to get this appointment scheduled for any further refills. AM

## 2021-01-03 DIAGNOSIS — F4323 Adjustment disorder with mixed anxiety and depressed mood: Secondary | ICD-10-CM | POA: Diagnosis not present

## 2021-01-16 DIAGNOSIS — F4323 Adjustment disorder with mixed anxiety and depressed mood: Secondary | ICD-10-CM | POA: Diagnosis not present

## 2021-01-24 DIAGNOSIS — F4323 Adjustment disorder with mixed anxiety and depressed mood: Secondary | ICD-10-CM | POA: Diagnosis not present

## 2021-01-31 DIAGNOSIS — F4323 Adjustment disorder with mixed anxiety and depressed mood: Secondary | ICD-10-CM | POA: Diagnosis not present

## 2021-02-04 ENCOUNTER — Other Ambulatory Visit: Payer: Self-pay | Admitting: Family Medicine

## 2021-02-14 ENCOUNTER — Other Ambulatory Visit: Payer: Self-pay | Admitting: Family Medicine

## 2021-04-10 ENCOUNTER — Telehealth: Payer: Self-pay

## 2021-04-10 ENCOUNTER — Other Ambulatory Visit: Payer: Self-pay | Admitting: Family Medicine

## 2021-04-10 DIAGNOSIS — E119 Type 2 diabetes mellitus without complications: Secondary | ICD-10-CM

## 2021-04-10 DIAGNOSIS — D582 Other hemoglobinopathies: Secondary | ICD-10-CM

## 2021-04-10 DIAGNOSIS — E785 Hyperlipidemia, unspecified: Secondary | ICD-10-CM

## 2021-04-10 NOTE — Telephone Encounter (Signed)
Orders entered

## 2021-04-10 NOTE — Telephone Encounter (Signed)
Please order patient's labs for upcoming appt.   Thanks

## 2021-04-16 ENCOUNTER — Other Ambulatory Visit: Payer: Self-pay | Admitting: Family Medicine

## 2021-04-16 DIAGNOSIS — E785 Hyperlipidemia, unspecified: Secondary | ICD-10-CM | POA: Diagnosis not present

## 2021-04-16 DIAGNOSIS — D582 Other hemoglobinopathies: Secondary | ICD-10-CM | POA: Diagnosis not present

## 2021-04-16 DIAGNOSIS — E119 Type 2 diabetes mellitus without complications: Secondary | ICD-10-CM | POA: Diagnosis not present

## 2021-04-17 LAB — COMPLETE METABOLIC PANEL WITH GFR
AG Ratio: 1.8 (calc) (ref 1.0–2.5)
ALT: 40 U/L (ref 9–46)
AST: 22 U/L (ref 10–35)
Albumin: 4.2 g/dL (ref 3.6–5.1)
Alkaline phosphatase (APISO): 87 U/L (ref 35–144)
BUN: 12 mg/dL (ref 7–25)
CO2: 28 mmol/L (ref 20–32)
Calcium: 8.8 mg/dL (ref 8.6–10.3)
Chloride: 99 mmol/L (ref 98–110)
Creat: 0.92 mg/dL (ref 0.70–1.33)
GFR, Est African American: 111 mL/min/{1.73_m2} (ref >=60)
GFR, Est Non African American: 96 mL/min/{1.73_m2} (ref >=60)
Globulin: 2.4 g/dL (calc) (ref 1.9–3.7)
Glucose, Bld: 303 mg/dL — ABNORMAL HIGH (ref 65–99)
Potassium: 4.2 mmol/L (ref 3.5–5.3)
Sodium: 135 mmol/L (ref 135–146)
Total Bilirubin: 1.4 mg/dL — ABNORMAL HIGH (ref 0.2–1.2)
Total Protein: 6.6 g/dL (ref 6.1–8.1)

## 2021-04-17 LAB — CBC WITH DIFFERENTIAL/PLATELET
Absolute Monocytes: 401 cells/uL (ref 200–950)
Basophils Absolute: 61 cells/uL (ref 0–200)
Basophils Relative: 0.9 %
Eosinophils Absolute: 503 cells/uL — ABNORMAL HIGH (ref 15–500)
Eosinophils Relative: 7.4 %
HCT: 50.2 % — ABNORMAL HIGH (ref 38.5–50.0)
Hemoglobin: 16.7 g/dL (ref 13.2–17.1)
Lymphs Abs: 2060 cells/uL (ref 850–3900)
MCH: 29.1 pg (ref 27.0–33.0)
MCHC: 33.3 g/dL (ref 32.0–36.0)
MCV: 87.6 fL (ref 80.0–100.0)
MPV: 10 fL (ref 7.5–12.5)
Monocytes Relative: 5.9 %
Neutro Abs: 3774 cells/uL (ref 1500–7800)
Neutrophils Relative %: 55.5 %
Platelets: 199 10*3/uL (ref 140–400)
RBC: 5.73 10*6/uL (ref 4.20–5.80)
RDW: 12.9 % (ref 11.0–15.0)
Total Lymphocyte: 30.3 %
WBC: 6.8 10*3/uL (ref 3.8–10.8)

## 2021-04-17 LAB — LIPID PANEL W/REFLEX DIRECT LDL
Cholesterol: 116 mg/dL (ref <200)
HDL: 31 mg/dL — ABNORMAL LOW (ref >=40)
LDL Cholesterol (Calc): 62 mg/dL (calc)
Non-HDL Cholesterol (Calc): 85 mg/dL (calc) (ref <130)
Total CHOL/HDL Ratio: 3.7 (calc) (ref <5.0)
Triglycerides: 157 mg/dL — ABNORMAL HIGH (ref <150)

## 2021-04-17 LAB — HEMOGLOBIN A1C
Hgb A1c MFr Bld: 10.3 % of total Hgb — ABNORMAL HIGH (ref <5.7)
Mean Plasma Glucose: 249 mg/dL
eAG (mmol/L): 13.8 mmol/L

## 2021-04-17 LAB — MICROALBUMIN / CREATININE URINE RATIO
Creatinine, Urine: 95 mg/dL (ref 20–320)
Microalb, Ur: <0.2 mg/dL

## 2021-04-18 ENCOUNTER — Ambulatory Visit (INDEPENDENT_AMBULATORY_CARE_PROVIDER_SITE_OTHER): Payer: BC Managed Care – PPO | Admitting: Family Medicine

## 2021-04-18 ENCOUNTER — Encounter: Payer: Self-pay | Admitting: Family Medicine

## 2021-04-18 ENCOUNTER — Other Ambulatory Visit: Payer: Self-pay

## 2021-04-18 ENCOUNTER — Other Ambulatory Visit: Payer: Self-pay | Admitting: Family Medicine

## 2021-04-18 DIAGNOSIS — N529 Male erectile dysfunction, unspecified: Secondary | ICD-10-CM | POA: Diagnosis not present

## 2021-04-18 DIAGNOSIS — L719 Rosacea, unspecified: Secondary | ICD-10-CM | POA: Diagnosis not present

## 2021-04-18 DIAGNOSIS — E119 Type 2 diabetes mellitus without complications: Secondary | ICD-10-CM | POA: Diagnosis not present

## 2021-04-18 DIAGNOSIS — E785 Hyperlipidemia, unspecified: Secondary | ICD-10-CM | POA: Diagnosis not present

## 2021-04-18 MED ORDER — TRULICITY 1.5 MG/0.5ML ~~LOC~~ SOAJ: 1.5000 mg | SUBCUTANEOUS | 1 refills | Status: DC

## 2021-04-18 MED ORDER — METRONIDAZOLE 1 % EX GEL: Freq: Every day | CUTANEOUS | 0 refills | Status: DC

## 2021-04-18 MED ORDER — ATORVASTATIN CALCIUM 40 MG PO TABS: 40.0000 mg | ORAL_TABLET | Freq: Every day | ORAL | 1 refills | Status: DC

## 2021-04-18 MED ORDER — TADALAFIL 20 MG PO TABS: ORAL_TABLET | ORAL | 11 refills | Status: DC

## 2021-04-18 MED ORDER — XIGDUO XR 10-1000 MG PO TB24: 1.0000 | ORAL_TABLET | Freq: Every day | ORAL | 1 refills | Status: DC

## 2021-04-18 NOTE — Patient Instructions (Signed)
Diabetes Mellitus and Nutrition, Adult When you have diabetes, or diabetes mellitus, it is very important to have healthy eating habits because your blood sugar (glucose) levels are greatly affected by what you eat and drink. Eating healthy foods in the right amounts, at about the same times every day, can help you:  Control your blood glucose.  Lower your risk of heart disease.  Improve your blood pressure.  Reach or maintain a healthy weight. What can affect my meal plan? Every person with diabetes is different, and each person has different needs for a meal plan. Your health care provider may recommend that you work with a dietitian to make a meal plan that is best for you. Your meal plan may vary depending on factors such as:  The calories you need.  The medicines you take.  Your weight.  Your blood glucose, blood pressure, and cholesterol levels.  Your activity level.  Other health conditions you have, such as heart or kidney disease. How do carbohydrates affect me? Carbohydrates, also called carbs, affect your blood glucose level more than any other type of food. Eating carbs naturally raises the amount of glucose in your blood. Carb counting is a method for keeping track of how many carbs you eat. Counting carbs is important to keep your blood glucose at a healthy level, especially if you use insulin or take certain oral diabetes medicines. It is important to know how many carbs you can safely have in each meal. This is different for every person. Your dietitian can help you calculate how many carbs you should have at each meal and for each snack. How does alcohol affect me? Alcohol can cause a sudden decrease in blood glucose (hypoglycemia), especially if you use insulin or take certain oral diabetes medicines. Hypoglycemia can be a life-threatening condition. Symptoms of hypoglycemia, such as sleepiness, dizziness, and confusion, are similar to symptoms of having too much  alcohol.  Do not drink alcohol if: ? Your health care provider tells you not to drink. ? You are pregnant, may be pregnant, or are planning to become pregnant.  If you drink alcohol: ? Do not drink on an empty stomach. ? Limit how much you use to:  0-1 drink a day for women.  0-2 drinks a day for men. ? Be aware of how much alcohol is in your drink. In the [place] one drink equals one 12 oz bottle of beer (355 mL), one 5 oz glass of wine (148 mL), or one 1 oz glass of hard liquor (44 mL). ? Keep yourself hydrated with water, diet soda, or unsweetened iced tea.  Keep in mind that regular soda, juice, and other mixers may contain a lot of sugar and must be counted as carbs. What are tips for following this plan? Reading food labels  Start by checking the serving size on the "Nutrition Facts" label of packaged foods and drinks. The amount of calories, carbs, fats, and other nutrients listed on the label is based on one serving of the item. Many items contain more than one serving per package.  Check the total grams (g) of carbs in one serving. You can calculate the number of servings of carbs in one serving by dividing the total carbs by 15. For example, if a food has 30 g of total carbs per serving, it would be equal to 2 servings of carbs.  Check the number of grams (g) of saturated fats and trans fats in one serving. Choose foods that have   a low amount or none of these fats.  Check the number of milligrams (mg) of salt (sodium) in one serving. Most people should limit total sodium intake to less than 2,300 mg per day.  Always check the nutrition information of foods labeled as "low-fat" or "nonfat." These foods may be higher in added sugar or refined carbs and should be avoided.  Talk to your dietitian to identify your daily goals for nutrients listed on the label. Shopping  Avoid buying canned, pre-made, or processed foods. These foods tend to be high in fat, sodium, and added  sugar.  Shop around the outside edge of the grocery store. This is where you will most often find fresh fruits and vegetables, bulk grains, fresh meats, and fresh dairy. Cooking  Use low-heat cooking methods, such as baking, instead of high-heat cooking methods like deep frying.  Cook using healthy oils, such as olive, canola, or sunflower oil.  Avoid cooking with butter, cream, or high-fat meats. Meal planning  Eat meals and snacks regularly, preferably at the same times every day. Avoid going long periods of time without eating.  Eat foods that are high in fiber, such as fresh fruits, vegetables, beans, and whole grains. Talk with your dietitian about how many servings of carbs you can eat at each meal.  Eat 4-6 oz (112-168 g) of lean protein each day, such as lean meat, chicken, fish, eggs, or tofu. One ounce (oz) of lean protein is equal to: ? 1 oz (28 g) of meat, chicken, or fish. ? 1 egg. ?  cup (62 g) of tofu.  Eat some foods each day that contain healthy fats, such as avocado, nuts, seeds, and fish.   What foods should I eat? Fruits Berries. Apples. Oranges. Peaches. Apricots. Plums. Grapes. Mango. Papaya. Pomegranate. Kiwi. Cherries. Vegetables Lettuce. Spinach. Leafy greens, including kale, chard, collard greens, and mustard greens. Beets. Cauliflower. Cabbage. Broccoli. Carrots. Green beans. Tomatoes. Peppers. Onions. Cucumbers. Brussels sprouts. Grains Whole grains, such as whole-wheat or whole-grain bread, crackers, tortillas, cereal, and pasta. Unsweetened oatmeal. Quinoa. Brown or wild rice. Meats and other proteins Seafood. Poultry without skin. Lean cuts of poultry and beef. Tofu. Nuts. Seeds. Dairy Low-fat or fat-free dairy products such as milk, yogurt, and cheese. The items listed above may not be a complete list of foods and beverages you can eat. Contact a dietitian for more information. What foods should I avoid? Fruits Fruits canned with  syrup. Vegetables Canned vegetables. Frozen vegetables with butter or cream sauce. Grains Refined white flour and flour products such as bread, pasta, snack foods, and cereals. Avoid all processed foods. Meats and other proteins Fatty cuts of meat. Poultry with skin. Breaded or fried meats. Processed meat. Avoid saturated fats. Dairy Full-fat yogurt, cheese, or milk. Beverages Sweetened drinks, such as soda or iced tea. The items listed above may not be a complete list of foods and beverages you should avoid. Contact a dietitian for more information. Questions to ask a health care provider  Do I need to meet with a diabetes educator?  Do I need to meet with a dietitian?  What number can I call if I have questions?  When are the best times to check my blood glucose? Where to find more information:  American Diabetes Association: diabetes.org  Academy of Nutrition and Dietetics: www.eatright.org  National Institute of Diabetes and Digestive and Kidney Diseases: www.niddk.nih.gov  Association of Diabetes Care and Education Specialists: www.diabeteseducator.org Summary  It is important to have healthy eating   habits because your blood sugar (glucose) levels are greatly affected by what you eat and drink.  A healthy meal plan will help you control your blood glucose and maintain a healthy lifestyle.  Your health care provider may recommend that you work with a dietitian to make a meal plan that is best for you.  Keep in mind that carbohydrates (carbs) and alcohol have immediate effects on your blood glucose levels. It is important to count carbs and to use alcohol carefully. This information is not intended to replace advice given to you by your health care provider. Make sure you discuss any questions you have with your health care provider. Document Revised: 09/14/2019 Document Reviewed: 09/14/2019 Elsevier Patient Education  2021 Elsevier Inc.  

## 2021-04-18 NOTE — Assessment & Plan Note (Signed)
Tadalafil renewed.  

## 2021-04-18 NOTE — Assessment & Plan Note (Signed)
Poorly controlled diabetes.  We will have him increase Trulicity to 1.5 mg weekly.  He will restart Xigduo.  Discussed making dietary changes for improvement elevated blood glucose. Return in about 5 months (around 09/18/2021) for HTN/DM.

## 2021-04-18 NOTE — Assessment & Plan Note (Signed)
Starting MetroGel as needed for management of rosacea.

## 2021-04-18 NOTE — Progress Notes (Signed)
Shane Williams - 50 y.o. male MRN [id number]  Date of birth: 1971-08-30  Subjective Chief Complaint  Patient presents with   Diabetes    HPI Shane Williams is a 50 year old male here today for follow-up visit.  He has a history of type 2 diabetes, ED and hyperlipidemia.  Diabetes has been poorly controlled with recent A1c of 10.3%.  He admits that his diet could be better and he has not been taking Trulicity or Xigduo due to being out of this medication.  He denies any symptoms related to his diabetes including neuropathy, increased urination or thirst.  He would like a refill on tadalafil as this works well for [name].  No side effects related to this.  He continues to have some issues with rosacea.  He is not currently using anything for treatment of this.  ROS:  A comprehensive ROS was completed and negative except as noted per HPI  Allergies  Allergen Reactions   Penicillins Swelling   Viagra [Sildenafil Citrate]     headache    Past Medical History:  Diagnosis Date   Low testosterone 11/27/2012   Type 2 diabetes mellitus (HCC) 11/27/2012    Past Surgical History:  Procedure Laterality Date   APPENDECTOMY     INCISE AND DRAIN ABCESS     4 times in last 2 years   SHOULDER ARTHROSCOPY      Social History   Socioeconomic History   Marital status: Married    Spouse name: Not on file   Number of children: Not on file   Years of education: Not on file   Highest education level: Not on file  Occupational History   Not on file  Tobacco Use   Smoking status: Every Day    Packs/day: 0.75    Years: 20.00    Pack years: 15.00    Types: Cigarettes    Last attempt to quit: 05/12/2017    Years since quitting: 3.9   Smokeless tobacco: Never  Vaping Use   Vaping Use: Every day  Substance and Sexual Activity   Alcohol use: Not Currently   Drug use: No   Sexual activity: Not on file  Other Topics Concern   Not on file  Social History Narrative   Not on file   Social  Determinants of Health   Financial Resource Strain: Not on file  Food Insecurity: Not on file  Transportation Needs: Not on file  Physical Activity: Not on file  Stress: Not on file  Social Connections: Not on file    Family History  Problem Relation Age of Onset   Diabetes Father     Health Maintenance  Topic Date Due   COVID-19 Vaccine (1) Never done   Pneumococcal Vaccine 69-62 Years old (1 - PCV) Never done   Hepatitis C Screening  Never done   Zoster Vaccines- Shingrix (1 of 2) Never done   OPHTHALMOLOGY EXAM  12/25/2019   INFLUENZA VACCINE  05/21/2021   HEMOGLOBIN A1C  10/16/2021   URINE MICROALBUMIN  04/16/2022   FOOT EXAM  04/18/2022   Fecal DNA (Cologuard)  01/10/2023   TETANUS/TDAP  08/06/2026   PNEUMOCOCCAL POLYSACCHARIDE VACCINE AGE 29-64 HIGH RISK  Completed   HIV Screening  Completed   HPV VACCINES  Aged Out     ----------------------------------------------------------------------------------------------------------------------------------------------------------------------------------------------------------------- Physical Exam BP 111/79 (BP Location: Left Arm, Patient Position: Sitting, Cuff Size: Normal)   Pulse 84   Temp (!) 97.2 F (36.2 C)   Ht 6' (1.829 m)  Wt 223 lb (101.2 kg)   SpO2 96%   BMI 30.24 kg/m   Physical Exam Constitutional:      Appearance: Normal appearance.  HENT:     Head: Normocephalic and atraumatic.  Eyes:     General: No scleral icterus. Cardiovascular:     Rate and Rhythm: Normal rate and regular rhythm.  Pulmonary:     Effort: Pulmonary effort is normal.     Breath sounds: Normal breath sounds.  Musculoskeletal:     Cervical back: Neck supple.  Skin:    General: Skin is warm.  Neurological:     General: No focal deficit present.     Mental Status: He is alert.  Psychiatric:        Mood and Affect: Mood normal.        Behavior: Behavior normal.     ------------------------------------------------------------------------------------------------------------------------------------------------------------------------------------------------------------------- Assessment and Plan  Type 2 diabetes mellitus without complication, without long-term current use of insulin (HCC) Poorly controlled diabetes.  We will have him increase Trulicity to 1.5 mg weekly.  He will restart Xigduo.  Discussed making dietary changes for improvement elevated blood glucose. Return in about 5 months (around 09/18/2021) for HTN/DM.   Rosacea Starting MetroGel as needed for management of rosacea.  Hyperlipidemia LDL goal <100 Lab Results  Component Value Date   LDLCALC 62 04/16/2021  Continue atorvastatin at current strength.  Erectile dysfunction Tadalafil renewed.   Meds ordered this encounter  Medications   Dapagliflozin-metFORMIN HCl ER (XIGDUO XR) 07-999 MG TB24    Sig: Take 1 tablet by mouth daily.    Dispense:  90 tablet    Refill:  1   atorvastatin (LIPITOR) 40 MG tablet    Sig: Take 1 tablet (40 mg total) by mouth daily.    Dispense:  90 tablet    Refill:  1   Dulaglutide (TRULICITY) 1.5 MG/0.5ML SOPN    Sig: Inject 1.5 mg into the skin once a week.    Dispense:  6 mL    Refill:  1   tadalafil (CIALIS) 20 MG tablet    Sig: TAKE 1/2 TO 1 TABLET BY MOUTH DAILY AS NEEDED FOR E.D.    Dispense:  30 tablet    Refill:  11    Return in about 5 months (around 09/18/2021) for HTN/DM.    This visit occurred during the SARS-CoV-2 public health emergency.  Safety protocols were in place, including screening questions prior to the visit, additional usage of staff PPE, and extensive cleaning of exam room while observing appropriate contact time as indicated for disinfecting solutions.

## 2021-04-18 NOTE — Assessment & Plan Note (Signed)
Lab Results  Component Value Date   LDLCALC 62 04/16/2021  Continue atorvastatin at current strength.

## 2021-07-24 ENCOUNTER — Other Ambulatory Visit: Payer: Self-pay | Admitting: Family Medicine

## 2021-08-09 LAB — HM DIABETES EYE EXAM

## 2021-08-10 ENCOUNTER — Encounter: Payer: Self-pay | Admitting: Family Medicine

## 2021-09-03 ENCOUNTER — Encounter: Payer: Self-pay | Admitting: Family Medicine

## 2021-09-03 ENCOUNTER — Other Ambulatory Visit: Payer: Self-pay

## 2021-09-03 ENCOUNTER — Ambulatory Visit (INDEPENDENT_AMBULATORY_CARE_PROVIDER_SITE_OTHER): Payer: BC Managed Care – PPO | Admitting: Family Medicine

## 2021-09-03 VITALS — BP 128/73 | HR 93 | Temp 98.0°F | Ht 72.0 in | Wt 220.0 lb

## 2021-09-03 DIAGNOSIS — E119 Type 2 diabetes mellitus without complications: Secondary | ICD-10-CM | POA: Diagnosis not present

## 2021-09-03 DIAGNOSIS — E785 Hyperlipidemia, unspecified: Secondary | ICD-10-CM | POA: Diagnosis not present

## 2021-09-03 LAB — POCT GLYCOSYLATED HEMOGLOBIN (HGB A1C): HbA1c, POC (controlled diabetic range): 7.7 % — AB (ref 0.0–7.0)

## 2021-09-03 MED ORDER — TRULICITY 1.5 MG/0.5ML ~~LOC~~ SOAJ: 1.5000 mg | SUBCUTANEOUS | 2 refills | Status: DC

## 2021-09-03 MED ORDER — XIGDUO XR 10-1000 MG PO TB24: 1.0000 | ORAL_TABLET | Freq: Every day | ORAL | 2 refills | Status: DC

## 2021-09-03 MED ORDER — ATORVASTATIN CALCIUM 40 MG PO TABS: 40.0000 mg | ORAL_TABLET | Freq: Every day | ORAL | 2 refills | Status: DC

## 2021-09-03 NOTE — Assessment & Plan Note (Signed)
Blood sugars have improved since last visit.  Continue Trulicity and Xigduo at current strength.  Continue to work on dietary changes for improvement of blood sugar.

## 2021-09-03 NOTE — Progress Notes (Signed)
Shane Williams - 50 y.o. male MRN [id number]  Date of birth: 1971/05/13  Subjective Chief Complaint  Patient presents with   Diabetes   Hypertension    HPI Shane Williams is a 50 year old male here today here today for follow-up visit.  Reports he is doing well at this time.  A1c improved today.  He is tolerating Trulicity and Xigduo well.  He has not noted any significant side effects with this.  He does check blood sugars occasionally at home.  3 here but [facility] 1 weeks.    He continues to tolerate atorvastatin well for management of hyperlipidemia.  No myalgias noted with this.  ROS:  A comprehensive ROS was completed and negative except as noted per HPI  Allergies  Allergen Reactions   Penicillins Swelling   Viagra [Sildenafil Citrate]     headache    Past Medical History:  Diagnosis Date   Low testosterone 11/27/2012   Type 2 diabetes mellitus (HCC) 11/27/2012    Past Surgical History:  Procedure Laterality Date   APPENDECTOMY     INCISE AND DRAIN ABCESS     4 times in last 2 years   SHOULDER ARTHROSCOPY      Social History   Socioeconomic History   Marital status: Married    Spouse name: Not on file   Number of children: Not on file   Years of education: Not on file   Highest education level: Not on file  Occupational History   Not on file  Tobacco Use   Smoking status: Every Day    Packs/day: 0.75    Years: 20.00    Pack years: 15.00    Types: Cigarettes    Last attempt to quit: 05/12/2017    Years since quitting: 4.3   Smokeless tobacco: Never  Vaping Use   Vaping Use: Every day  Substance and Sexual Activity   Alcohol use: Not Currently   Drug use: No   Sexual activity: Not on file  Other Topics Concern   Not on file  Social History Narrative   Not on file   Social Determinants of Health   Financial Resource Strain: Not on file  Food Insecurity: Not on file  Transportation Needs: Not on file  Physical Activity: Not on file  Stress: Not on file   Social Connections: Not on file    Family History  Problem Relation Age of Onset   Diabetes Father     Health Maintenance  Topic Date Due   Hepatitis C Screening  Never done   INFLUENZA VACCINE  01/18/2022 (Originally 05/21/2021)   Pneumococcal Vaccine 57-69 Years old (2 - PCV) 09/03/2022 (Originally 08/06/2017)   HEMOGLOBIN A1C  03/03/2022   URINE MICROALBUMIN  04/16/2022   FOOT EXAM  04/18/2022   OPHTHALMOLOGY EXAM  08/09/2022   Fecal DNA (Cologuard)  01/10/2023   TETANUS/TDAP  08/06/2026   HIV Screening  Completed   HPV VACCINES  Aged Out   COVID-19 Vaccine  Discontinued   Zoster Vaccines- Shingrix  Discontinued     ----------------------------------------------------------------------------------------------------------------------------------------------------------------------------------------------------------------- Physical Exam BP 128/73 (BP Location: Left Arm, Patient Position: Sitting, Cuff Size: Normal)   Pulse 93   Temp 98 F (36.7 C)   Ht 6' (1.829 m)   Wt 220 lb (99.8 kg)   SpO2 97%   BMI 29.84 kg/m   Physical Exam Constitutional:      Appearance: Normal appearance.  Eyes:     General: Scleral icterus present.  Cardiovascular:     Rate and  Rhythm: Normal rate and regular rhythm.  Pulmonary:     Effort: Pulmonary effort is normal.     Breath sounds: Normal breath sounds.  Musculoskeletal:     Cervical back: Neck supple.  Neurological:     General: No focal deficit present.     Mental Status: He is alert.  Psychiatric:        Mood and Affect: Mood normal.        Behavior: Behavior normal.    ------------------------------------------------------------------------------------------------------------------------------------------------------------------------------------------------------------------- Assessment and Plan  Type 2 diabetes mellitus without complication, without long-term current use of insulin (HCC) Blood sugars have improved  since last visit.  Continue Trulicity and Xigduo at current strength.  Continue to work on dietary changes for improvement of blood sugar.  Hyperlipidemia LDL goal <100 He is tolerating atorvastatin well.  He will continue her current strength.   Meds ordered this encounter  Medications   atorvastatin (LIPITOR) 40 MG tablet    Sig: Take 1 tablet (40 mg total) by mouth daily.    Dispense:  90 tablet    Refill:  2   Dapagliflozin-metFORMIN HCl ER (XIGDUO XR) 07-999 MG TB24    Sig: Take 1 tablet by mouth daily.    Dispense:  90 tablet    Refill:  2   Dulaglutide (TRULICITY) 1.5 MG/0.5ML SOPN    Sig: Inject 1.5 mg into the skin once a week.    Dispense:  6 mL    Refill:  2    Return in about 6 months (around 03/03/2022) for HTN/DM.    This visit occurred during the SARS-CoV-2 public health emergency.  Safety protocols were in place, including screening questions prior to the visit, additional usage of staff PPE, and extensive cleaning of exam room while observing appropriate contact time as indicated for disinfecting solutions.

## 2021-09-03 NOTE — Patient Instructions (Signed)
Great to see you today! Continue current medications.  See me again in 6 months.  

## 2021-09-03 NOTE — Assessment & Plan Note (Signed)
He is tolerating atorvastatin well.  He will continue her current strength.

## 2022-03-04 ENCOUNTER — Ambulatory Visit: Payer: BC Managed Care – PPO | Admitting: Family Medicine

## 2022-04-11 ENCOUNTER — Ambulatory Visit: Payer: BC Managed Care – PPO | Admitting: Family Medicine

## 2022-04-11 ENCOUNTER — Encounter: Payer: Self-pay | Admitting: Family Medicine

## 2022-04-11 VITALS — BP 109/67 | HR 84 | Ht 72.0 in | Wt 217.0 lb

## 2022-04-11 DIAGNOSIS — E119 Type 2 diabetes mellitus without complications: Secondary | ICD-10-CM | POA: Diagnosis not present

## 2022-04-11 DIAGNOSIS — N529 Male erectile dysfunction, unspecified: Secondary | ICD-10-CM | POA: Diagnosis not present

## 2022-04-11 DIAGNOSIS — Z125 Encounter for screening for malignant neoplasm of prostate: Secondary | ICD-10-CM | POA: Diagnosis not present

## 2022-04-11 DIAGNOSIS — E785 Hyperlipidemia, unspecified: Secondary | ICD-10-CM | POA: Diagnosis not present

## 2022-04-11 MED ORDER — XIGDUO XR 10-1000 MG PO TB24: 1.0000 | ORAL_TABLET | Freq: Every day | ORAL | 3 refills | Status: DC

## 2022-04-11 MED ORDER — ATORVASTATIN CALCIUM 40 MG PO TABS
40.0000 mg | ORAL_TABLET | Freq: Every day | ORAL | 3 refills | Status: DC
Start: 2022-04-11 — End: 2023-04-10

## 2022-04-11 MED ORDER — TRULICITY 1.5 MG/0.5ML ~~LOC~~ SOAJ: 1.5000 mg | SUBCUTANEOUS | 3 refills | Status: DC

## 2022-04-11 MED ORDER — TADALAFIL 20 MG PO TABS: ORAL_TABLET | ORAL | 11 refills | Status: DC

## 2022-04-11 NOTE — Assessment & Plan Note (Signed)
Will continue tadalafil as needed.

## 2022-04-11 NOTE — Addendum Note (Signed)
Addended by: Mammie Lorenzo on: 04/11/2022 08:51 AM   Modules accepted: Level of Service

## 2022-04-11 NOTE — Assessment & Plan Note (Signed)
Doing well with current medications.  Updated A1c ordered today.  Continue dietary changes.

## 2022-04-11 NOTE — Progress Notes (Signed)
Shane Williams - 51 y.o. male MRN [id number]  Date of birth: [birth year]  Subjective Chief Complaint  Patient presents with   Hypertension   Diabetes    HPI Shane Williams is a 51 y.o. male here today for follow up visit.  Reports that he is doing well at this time.   Continues on xigduo and trulicity for management of diabetes.  Tolerating these well at current strength.  Blood sugars at home have looked pretty good.  Denies hypoglycemia.    Tolerating atorvastatin well for management of HLD.  No myalgias or GI upset with this.   Tadalafil still working well as needed for ED symptoms.    Review of Systems  Constitutional:  Negative for chills, fever, malaise/fatigue and weight loss.  HENT:  Negative for congestion, ear pain and sore throat.   Eyes:  Negative for blurred vision, double vision and pain.  Respiratory:  Negative for cough and shortness of breath.   Cardiovascular:  Negative for chest pain and palpitations.  Gastrointestinal:  Negative for abdominal pain, blood in stool, constipation, heartburn and nausea.  Genitourinary:  Negative for dysuria and urgency.  Musculoskeletal:  Negative for joint pain and myalgias.  Neurological:  Negative for dizziness and headaches.  Endo/Heme/Allergies:  Does not bruise/bleed easily.  Psychiatric/Behavioral:  Negative for depression. The patient is not nervous/anxious and does not have insomnia.     Allergies  Allergen Reactions   Penicillins Swelling   Viagra [Sildenafil Citrate]     headache    Past Medical History:  Diagnosis Date   Low testosterone 11/27/2012   Type 2 diabetes mellitus (HCC) 11/27/2012    Past Surgical History:  Procedure Laterality Date   APPENDECTOMY     INCISE AND DRAIN ABCESS     4 times in last 2 years   SHOULDER ARTHROSCOPY      Social History   Socioeconomic History   Marital status: Married    Spouse name: Not on file   Number of children: Not on file   Years of education: Not on file    Highest education level: Not on file  Occupational History   Not on file  Tobacco Use   Smoking status: Every Day    Packs/day: 0.75    Years: 20.00    Total pack years: 15.00    Types: Cigarettes    Last attempt to quit: 05/12/2017    Years since quitting: 4.9   Smokeless tobacco: Never  Vaping Use   Vaping Use: Every day  Substance and Sexual Activity   Alcohol use: Not Currently   Drug use: No   Sexual activity: Not on file  Other Topics Concern   Not on file  Social History Narrative   Not on file   Social Determinants of Health   Financial Resource Strain: Not on file  Food Insecurity: Not on file  Transportation Needs: Not on file  Physical Activity: Not on file  Stress: Not on file  Social Connections: Not on file    Family History  Problem Relation Age of Onset   Diabetes Father     Health Maintenance  Topic Date Due   Hepatitis C Screening  Never done   HEMOGLOBIN A1C  03/03/2022   URINE MICROALBUMIN  04/16/2022   FOOT EXAM  04/18/2022   INFLUENZA VACCINE  05/21/2022   OPHTHALMOLOGY EXAM  08/09/2022   Fecal DNA (Cologuard)  01/10/2023   TETANUS/TDAP  08/06/2026   HIV Screening  Completed   HPV VACCINES  Aged Out   COVID-19 Vaccine  Discontinued   Zoster Vaccines- Shingrix  Discontinued     ----------------------------------------------------------------------------------------------------------------------------------------------------------------------------------------------------------------- Physical Exam BP 109/67 (BP Location: Left Arm, Patient Position: Sitting, Cuff Size: Normal)   Pulse 84   Ht 6' (1.829 m)   Wt 217 lb (98.4 kg)   SpO2 97%   BMI 29.43 kg/m   Physical Exam Constitutional:      General: He is not in acute distress. HENT:     Head: Normocephalic and atraumatic.     Right Ear: Tympanic membrane and external ear normal.     Left Ear: Tympanic membrane and external ear normal.  Eyes:     General: No scleral  icterus. Neck:     Thyroid: No thyromegaly.  Cardiovascular:     Rate and Rhythm: Normal rate and regular rhythm.     Heart sounds: Normal heart sounds.  Pulmonary:     Effort: Pulmonary effort is normal.     Breath sounds: Normal breath sounds.  Abdominal:     General: Bowel sounds are normal. There is no distension.     Palpations: Abdomen is soft.     Tenderness: There is no abdominal tenderness. There is no guarding.  Musculoskeletal:     Cervical back: Normal range of motion.  Lymphadenopathy:     Cervical: No cervical adenopathy.  Skin:    General: Skin is warm and dry.     Findings: No rash.  Neurological:     Mental Status: He is alert and oriented to person, place, and time.     Cranial Nerves: No cranial nerve deficit.     Motor: No abnormal muscle tone.  Psychiatric:        Mood and Affect: Mood normal.        Behavior: Behavior normal.     ------------------------------------------------------------------------------------------------------------------------------------------------------------------------------------------------------------------- Assessment and Plan  Hyperlipidemia LDL goal <100 Tolerating atorvastatin well.  Updating lipid panel today.   Type 2 diabetes mellitus without complication, without long-term current use of insulin (HCC) Doing well with current medications.  Updated A1c ordered today.  Continue dietary changes.   Erectile dysfunction Will continue tadalafil as needed.    Meds ordered this encounter  Medications   Dulaglutide (TRULICITY) 1.5 MG/0.5ML SOPN    Sig: Inject 1.5 mg into the skin once a week.    Dispense:  6 mL    Refill:  3   Dapagliflozin-metFORMIN HCl ER (XIGDUO XR) 07-999 MG TB24    Sig: Take 1 tablet by mouth daily.    Dispense:  90 tablet    Refill:  3   atorvastatin (LIPITOR) 40 MG tablet    Sig: Take 1 tablet (40 mg total) by mouth daily.    Dispense:  90 tablet    Refill:  3   tadalafil (CIALIS) 20 MG  tablet    Sig: TAKE 1/2 TO 1 TABLET BY MOUTH DAILY AS NEEDED FOR E.D.    Dispense:  30 tablet    Refill:  11    No follow-ups on file.    This visit occurred during the SARS-CoV-2 public health emergency.  Safety protocols were in place, including screening questions prior to the visit, additional usage of staff PPE, and extensive cleaning of exam room while observing appropriate contact time as indicated for disinfecting solutions.

## 2022-04-12 LAB — COMPLETE METABOLIC PANEL WITH GFR
AG Ratio: 1.7 (calc) (ref 1.0–2.5)
ALT: 29 U/L (ref 9–46)
AST: 19 U/L (ref 10–35)
Albumin: 4.5 g/dL (ref 3.6–5.1)
Alkaline phosphatase (APISO): 90 U/L (ref 35–144)
BUN: 9 mg/dL (ref 7–25)
CO2: 26 mmol/L (ref 20–32)
Calcium: 9.4 mg/dL (ref 8.6–10.3)
Chloride: 103 mmol/L (ref 98–110)
Creat: 0.98 mg/dL (ref 0.70–1.30)
Globulin: 2.7 g/dL (calc) (ref 1.9–3.7)
Glucose, Bld: 95 mg/dL (ref 65–99)
Potassium: 4.3 mmol/L (ref 3.5–5.3)
Sodium: 139 mmol/L (ref 135–146)
Total Bilirubin: 1 mg/dL (ref 0.2–1.2)
Total Protein: 7.2 g/dL (ref 6.1–8.1)
eGFR: 93 mL/min/{1.73_m2} (ref >=60)

## 2022-04-12 LAB — PSA: PSA: 0.43 ng/mL (ref <=4.00)

## 2022-04-12 LAB — CBC WITH DIFFERENTIAL/PLATELET
Absolute Monocytes: 508 cells/uL (ref 200–950)
Basophils Absolute: 90 cells/uL (ref 0–200)
Basophils Relative: 1.1 %
Eosinophils Absolute: 443 cells/uL (ref 15–500)
Eosinophils Relative: 5.4 %
HCT: 53.4 % — ABNORMAL HIGH (ref 38.5–50.0)
Hemoglobin: 18 g/dL — ABNORMAL HIGH (ref 13.2–17.1)
Lymphs Abs: 2132 cells/uL (ref 850–3900)
MCH: 29.2 pg (ref 27.0–33.0)
MCHC: 33.7 g/dL (ref 32.0–36.0)
MCV: 86.7 fL (ref 80.0–100.0)
MPV: 9.2 fL (ref 7.5–12.5)
Monocytes Relative: 6.2 %
Neutro Abs: 5027 cells/uL (ref 1500–7800)
Neutrophils Relative %: 61.3 %
Platelets: 240 10*3/uL (ref 140–400)
RBC: 6.16 10*6/uL — ABNORMAL HIGH (ref 4.20–5.80)
RDW: 12.7 % (ref 11.0–15.0)
Total Lymphocyte: 26 %
WBC: 8.2 10*3/uL (ref 3.8–10.8)

## 2022-04-12 LAB — HEMOGLOBIN A1C
Hgb A1c MFr Bld: 6.9 % of total Hgb — ABNORMAL HIGH (ref <5.7)
Mean Plasma Glucose: 151 mg/dL
eAG (mmol/L): 8.4 mmol/L

## 2022-04-12 LAB — LIPID PANEL W/REFLEX DIRECT LDL
Cholesterol: 95 mg/dL (ref <200)
HDL: 32 mg/dL — ABNORMAL LOW (ref >=40)
LDL Cholesterol (Calc): 43 mg/dL (calc)
Non-HDL Cholesterol (Calc): 63 mg/dL (calc) (ref <130)
Total CHOL/HDL Ratio: 3 (calc) (ref <5.0)
Triglycerides: 114 mg/dL (ref <150)

## 2022-04-22 ENCOUNTER — Telehealth: Payer: Self-pay

## 2022-04-22 NOTE — Telephone Encounter (Addendum)
Initiated Prior authorization UK[facility]:[id number] 1.5MG /0.5ML pen-injectors Via: Covermymeds Case/Key:[id number] Status: approved  as of 04/22/22 Reason: approved through 04/23/2023 Notified Pt via: Mychart

## 2022-06-18 ENCOUNTER — Ambulatory Visit
Admission: EM | Admit: 2022-06-18 | Discharge: 2022-06-18 | Disposition: A | Discharge status: Home / Self Care | Payer: BC Managed Care – PPO | Attending: Family Medicine | Admitting: Family Medicine

## 2022-06-18 DIAGNOSIS — T7840XA Allergy, unspecified, initial encounter: Secondary | ICD-10-CM

## 2022-06-18 DIAGNOSIS — R21 Rash and other nonspecific skin eruption: Secondary | ICD-10-CM

## 2022-06-18 MED ORDER — PREDNISONE 50 MG PO TABS: ORAL_TABLET | ORAL | 0 refills | Status: DC

## 2022-06-18 NOTE — ED Provider Notes (Signed)
[facility]    CSN: [id number] Arrival date & time: 06/18/22  1735      History   Chief Complaint Chief Complaint  Patient presents with   Rash    HPI Shane Williams is a 51 y.o. male.   HPI  Patient has a rash on his left clavicle that is expanding.  He states it burns and stings, also itches.  He has been putting cortisone cream on it.  In spite of this it is growing .  He has not had shingles vaccination.   the rash did come on after yard work.  Past Medical History:  Diagnosis Date   Low testosterone 11/27/2012   Type 2 diabetes mellitus (HCC) 11/27/2012    Patient Active Problem List   Diagnosis Date Noted   Type 2 diabetes mellitus without complication, without long-term current use of insulin (HCC) 12/24/2019   Rosacea 12/23/2017   Erectile dysfunction 12/23/2013   Hyperlipidemia LDL goal <100 12/08/2012   Onychomycosis of multiple toenails with type 2 diabetes mellitus (HCC) 11/27/2012   Low testosterone 11/27/2012    Past Surgical History:  Procedure Laterality Date   APPENDECTOMY     INCISE AND DRAIN ABCESS     4 times in last 2 years   SHOULDER ARTHROSCOPY         Home Medications    Prior to Admission medications   Medication Sig Start Date End Date Taking? Authorizing Provider  predniSONE (DELTASONE) 50 MG tablet Take once a day for 5 days 06/18/22  Yes Eustace Moore, MD  AMBULATORY NON FORMULARY MEDICATION One Touch Ultra Blue Test Strips.  Diagnosis: Diabetes Mellitus. Test 2 times a day 09/03/16   Rodolph Bong, MD  aspirin EC 81 MG tablet Take 1 tablet (81 mg total) by mouth daily. 03/05/18   Rodolph Bong, MD  atorvastatin (LIPITOR) 40 MG tablet Take 1 tablet (40 mg total) by mouth daily. 04/11/22   Everrett Coombe, DO  Dapagliflozin-metFORMIN HCl ER (XIGDUO XR) 07-999 MG TB24 Take 1 tablet by mouth daily. 04/11/22   Everrett Coombe, DO  Dulaglutide (TRULICITY) 1.5 MG/0.5ML SOPN Inject 1.5 mg into the skin once a week. 04/11/22    Everrett Coombe, DO  Lancets [facility] ULTRASOFT) lancets Use as directed 12/08/12   Laren Boom, DO  tadalafil (CIALIS) 20 MG tablet TAKE 1/2 TO 1 TABLET BY MOUTH DAILY AS NEEDED FOR E.D. 04/11/22   Everrett Coombe, DO    Family History Family History  Problem Relation Age of Onset   Diabetes Father     Social History Social History   Tobacco Use   Smoking status: Every Day    Packs/day: 0.75    Years: 20.00    Total pack years: 15.00    Types: Cigarettes    Last attempt to quit: 05/12/2017    Years since quitting: 5.1   Smokeless tobacco: Never  Vaping Use   Vaping Use: Every day  Substance Use Topics   Alcohol use: Not Currently   Drug use: No     Allergies   Penicillins and Viagra [sildenafil citrate]   Review of Systems Review of Systems See HPI  Physical Exam Triage Vital Signs ED Triage Vitals [06/18/22 1804]  Enc Vitals Group     BP 127/83     Pulse Rate 62     Resp 16     Temp 98.8 F (37.1 C)     Temp Source Oral     SpO2 98 %  Weight      Height      Head Circumference      Peak Flow      Pain Score      Pain Loc      Pain Edu?      Excl. in GC?    No data found.  Updated Vital Signs BP 127/83 (BP Location: Left Arm)   Pulse 62   Temp 98.8 F (37.1 C) (Oral)   Resp 18   SpO2 98%       Physical Exam Constitutional:      General: He is not in acute distress.    Appearance: Normal appearance. He is well-developed.  HENT:     Head: Normocephalic and atraumatic.  Eyes:     Conjunctiva/sclera: Conjunctivae normal.     Pupils: Pupils are equal, round, and reactive to light.  Cardiovascular:     Rate and Rhythm: Normal rate.  Pulmonary:     Effort: Pulmonary effort is normal. No respiratory distress.  Abdominal:     General: There is no distension.     Palpations: Abdomen is soft.  Musculoskeletal:        General: Normal range of motion.     Cervical back: Normal range of motion.  Skin:    General: Skin is warm and dry.      Comments: There is an 8 cm patch of tiny vesicles that are clustered around the erythematous mark that may be a puncture.  Surrounding this there are scattered vesicles and additional 2 to 3 cm.  The area is indurated and slightly raised.  Neurological:     General: No focal deficit present.     Mental Status: He is alert.  Psychiatric:        Mood and Affect: Mood normal.        Behavior: Behavior normal.      UC Treatments / Results  Labs (all labs ordered are listed, but only abnormal results are displayed) Labs Reviewed - No data to display  EKG   Radiology No results found.  Procedures Procedures (including critical care time)  Medications Ordered in UC Medications - No data to display  Initial Impression / Assessment and Plan / UC Course  I have reviewed the triage vital signs and the nursing notes.  Pertinent labs & imaging results that were available during my care of the patient were reviewed by me and considered in my medical decision making (see chart for details).     It looks little bit like contact dermatitis, but more like it may be an allergic reaction to an insect bite.  Discussed management with antihistamines, ice, and cortisone.  Return as needed Final Clinical Impressions(s) / UC Diagnoses   Final diagnoses:  Allergic reaction, initial encounter  Rash and nonspecific skin eruption     Discharge Instructions      Take prednisone once a day for 5 days Start today Make continue cortisone cream May take Benadryl at nighttime and Claritin or Zyrtec during the daytime if you have itching and discomfort See your doctor if not improving by next week   ED Prescriptions     Medication Sig Dispense Auth. Provider   predniSONE (DELTASONE) 50 MG tablet Take once a day for 5 days 5 tablet Eustace Moore, MD      PDMP not reviewed this encounter.   Eustace Moore, MD 06/18/22 [phone]

## 2022-06-18 NOTE — Discharge Instructions (Signed)
Take prednisone once a day for 5 days Start today Make continue cortisone cream May take Benadryl at nighttime and Claritin or Zyrtec during the daytime if you have itching and discomfort See your doctor if not improving by next week

## 2022-06-18 NOTE — ED Triage Notes (Signed)
Pt reports a itchy rash on his collar bone x 2-3 days. He is using hydrocortisone cream with no relief.

## 2022-10-11 ENCOUNTER — Ambulatory Visit: Payer: BC Managed Care – PPO | Admitting: Family Medicine

## 2022-10-23 ENCOUNTER — Ambulatory Visit: Payer: BC Managed Care – PPO | Admitting: Family Medicine

## 2022-10-30 ENCOUNTER — Encounter: Payer: Self-pay | Admitting: Family Medicine

## 2022-10-30 ENCOUNTER — Ambulatory Visit: Payer: BC Managed Care – PPO | Admitting: Family Medicine

## 2022-10-30 VITALS — BP 101/66 | HR 85 | Ht 72.0 in | Wt 222.0 lb

## 2022-10-30 DIAGNOSIS — E119 Type 2 diabetes mellitus without complications: Secondary | ICD-10-CM

## 2022-10-30 LAB — POCT GLYCOSYLATED HEMOGLOBIN (HGB A1C): HbA1c, POC (controlled diabetic range): 8.6 % — AB (ref 0.0–7.0)

## 2022-10-30 LAB — POCT UA - MICROALBUMIN
Albumin/Creatinine Ratio, Urine, POC: <30
Creatinine, POC: 100 mg/dL
Microalbumin Ur, POC: 10 mg/L

## 2022-10-30 NOTE — Assessment & Plan Note (Signed)
Control of his diabetes is worsened since last visit.  I recommend working on dietary changes.  Blood sugars are not improving at next visit we will need to escalate medical therapy.

## 2022-10-30 NOTE — Progress Notes (Signed)
Shane Williams - 52 y.o. male MRN [id number]  Date of birth: [birth year]  Subjective Chief Complaint  Patient presents with   Diabetes    HPI Shane Williams is a 52 year old male here today for follow-up visit.  Reports he is feeling well.  Feels like he is doing pretty well with current medications.  He is using Trulicity most weeks.  He is taking Xigduo daily.  Tolerating Trulicity well at current strength.  He has not had any significant side effects with medication at current strength.  He is not checking blood sugars regularly.  Admits that diet could be better over the past several weeks.  ROS:  A comprehensive ROS was completed and negative except as noted per HPI  Allergies  Allergen Reactions   Penicillins Swelling   Viagra [Sildenafil Citrate]     headache    Past Medical History:  Diagnosis Date   Low testosterone 11/27/2012   Type 2 diabetes mellitus ([facility]) 11/27/2012    Past Surgical History:  Procedure Laterality Date   APPENDECTOMY     INCISE AND DRAIN ABCESS     4 times in last 2 years   SHOULDER ARTHROSCOPY      Social History   Socioeconomic History   Marital status: Married    Spouse name: Not on file   Number of children: Not on file   Years of education: Not on file   Highest education level: Not on file  Occupational History   Not on file  Tobacco Use   Smoking status: Every Day    Packs/day: 0.75    Years: 20.00    Total pack years: 15.00    Types: Cigarettes    Last attempt to quit: 05/12/2017    Years since quitting: 5.4   Smokeless tobacco: Never  Vaping Use   Vaping Use: Every day  Substance and Sexual Activity   Alcohol use: Not Currently   Drug use: No   Sexual activity: Not on file  Other Topics Concern   Not on file  Social History Narrative   Not on file   Social Determinants of Health   Financial Resource Strain: Not on file  Food Insecurity: Not on file  Transportation Needs: Not on file  Physical Activity: Not on file  Stress:  Not on file  Social Connections: Not on file    Family History  Problem Relation Age of Onset   Diabetes Father     Health Maintenance  Topic Date Due   Hepatitis C Screening  04/12/2023 (Originally 11/03/1988)   OPHTHALMOLOGY EXAM  10/31/2023 (Originally 08/09/2022)   INFLUENZA VACCINE  01/19/2024 (Originally 05/21/2022)   Fecal DNA (Cologuard)  01/10/2023   Diabetic kidney evaluation - eGFR measurement  04/12/2023   HEMOGLOBIN A1C  04/30/2023   Diabetic kidney evaluation - Urine ACR  10/31/2023   FOOT EXAM  10/31/2023   DTaP/Tdap/Td (2 - Td or Tdap) 08/06/2026   HIV Screening  Completed   HPV VACCINES  Aged Out   COVID-19 Vaccine  Discontinued   Zoster Vaccines- Shingrix  Discontinued     ----------------------------------------------------------------------------------------------------------------------------------------------------------------------------------------------------------------- Physical Exam BP 101/66 (BP Location: Right Arm, Patient Position: Sitting, Cuff Size: Large)   Pulse 85   Ht 6' (1.829 m)   Wt 222 lb (100.7 kg)   SpO2 97%   BMI 30.11 kg/m   Physical Exam Constitutional:      Appearance: Normal appearance.  HENT:     Head: Normocephalic and atraumatic.  Eyes:     General:  No scleral icterus. Musculoskeletal:     Cervical back: Neck supple.  Neurological:     Mental Status: He is alert.  Psychiatric:        Mood and Affect: Mood normal.        Behavior: Behavior normal.     ------------------------------------------------------------------------------------------------------------------------------------------------------------------------------------------------------------------- Assessment and Plan  Type 2 diabetes mellitus without complication, without long-term current use of insulin (HCC) Control of his diabetes is worsened since last visit.  I recommend working on dietary changes.  Blood sugars are not improving at next visit we  will need to escalate medical therapy.   No orders of the defined types were placed in this encounter.   Return in about 3 months (around 01/29/2023) for T2DM/HTN.    This visit occurred during the SARS-CoV-2 public health emergency.  Safety protocols were in place, including screening questions prior to the visit, additional usage of staff PPE, and extensive cleaning of exam room while observing appropriate contact time as indicated for disinfecting solutions.

## 2023-01-24 ENCOUNTER — Telehealth: Payer: Self-pay

## 2023-01-24 MED ORDER — TRULICITY 3 MG/0.5ML ~~LOC~~ SOAJ: 3.0000 mg | SUBCUTANEOUS | 1 refills | Status: DC

## 2023-01-24 NOTE — Telephone Encounter (Signed)
Pt states [facility] are back ordered for Trulicity 1.5mg  and have been for about 1 month.   States [facility] has higher dosage of Tr[facility] available.  Please advise.

## 2023-01-24 NOTE — Telephone Encounter (Signed)
Let's go ahead and move up to the 3mg .  Updated Rx sent in.   CM

## 2023-01-27 NOTE — Telephone Encounter (Signed)
Pt has been advised of dosage change and Rx sent to pharmacy.

## 2023-01-29 ENCOUNTER — Ambulatory Visit: Payer: BC Managed Care – PPO | Admitting: Family Medicine

## 2023-04-10 ENCOUNTER — Other Ambulatory Visit: Payer: Self-pay | Admitting: Family Medicine

## 2023-05-04 ENCOUNTER — Other Ambulatory Visit: Payer: Self-pay | Admitting: Family Medicine

## 2023-05-05 NOTE — Telephone Encounter (Signed)
Patient needs appointment for additional refills.

## 2023-05-05 NOTE — Telephone Encounter (Signed)
Patient scheduled for 05/14/23, patient states that he'll be out of medications soon, thanks.

## 2023-05-14 ENCOUNTER — Encounter: Payer: Self-pay | Admitting: Family Medicine

## 2023-05-14 ENCOUNTER — Ambulatory Visit (INDEPENDENT_AMBULATORY_CARE_PROVIDER_SITE_OTHER): Payer: BC Managed Care – PPO | Admitting: Family Medicine

## 2023-05-14 VITALS — BP 113/69 | HR 88 | Ht 72.0 in | Wt 209.0 lb

## 2023-05-14 DIAGNOSIS — E785 Hyperlipidemia, unspecified: Secondary | ICD-10-CM

## 2023-05-14 DIAGNOSIS — Z1211 Encounter for screening for malignant neoplasm of colon: Secondary | ICD-10-CM

## 2023-05-14 DIAGNOSIS — E119 Type 2 diabetes mellitus without complications: Secondary | ICD-10-CM | POA: Diagnosis not present

## 2023-05-14 MED ORDER — ATORVASTATIN CALCIUM 40 MG PO TABS: 40.0000 mg | ORAL_TABLET | Freq: Every day | ORAL | 1 refills | Status: DC

## 2023-05-14 MED ORDER — TRULICITY 3 MG/0.5ML ~~LOC~~ SOAJ: 3.0000 mg | SUBCUTANEOUS | 1 refills | Status: DC

## 2023-05-14 MED ORDER — DAPAGLIFLOZIN PRO-METFORMIN ER 10-1000 MG PO TB24: 1.0000 | ORAL_TABLET | Freq: Every day | ORAL | 1 refills | Status: DC

## 2023-05-14 MED ORDER — TADALAFIL 20 MG PO TABS: ORAL_TABLET | ORAL | 11 refills | Status: AC

## 2023-05-14 NOTE — Progress Notes (Signed)
Shane Williams - 52 y.o. male MRN [id number]  Date of birth: 08-19-1971  Subjective No chief complaint on file.   HPI Shane Williams is a 52 y.o. male here today for follow up visit.   He reports that he is doing well.  Continues on trulicity as well as xigduo for management of diabetes.  Tolerating medications well at current strength.  Blood sugars at home have been pretty well controlled.  He feels that diet has been pretty good.  He remains pretty active.  Weight is down about 13 lbs.  Tolerating atoravastatin well for associated HLD.   Due for cologuard.    Needs biometric screening completed.   ROS:  A comprehensive ROS was completed and negative except as noted per HPI  Allergies  Allergen Reactions   Penicillins Swelling   Viagra [Sildenafil Citrate]     headache    Past Medical History:  Diagnosis Date   Low testosterone 11/27/2012   Type 2 diabetes mellitus (HCC) 11/27/2012    Past Surgical History:  Procedure Laterality Date   APPENDECTOMY     INCISE AND DRAIN ABCESS     4 times in last 2 years   SHOULDER ARTHROSCOPY      Social History   Socioeconomic History   Marital status: Married    Spouse name: Not on file   Number of children: Not on file   Years of education: Not on file   Highest education level: Not on file  Occupational History   Not on file  Tobacco Use   Smoking status: Every Day    Current packs/day: 0.00    Average packs/day: 0.8 packs/day for 20.0 years (15.0 ttl pk-yrs)    Types: Cigarettes    Start date: 05/12/1997    Last attempt to quit: 05/12/2017    Years since quitting: 6.0   Smokeless tobacco: Never  Vaping Use   Vaping status: Every Day  Substance and Sexual Activity   Alcohol use: Not Currently   Drug use: No   Sexual activity: Not on file  Other Topics Concern   Not on file  Social History Narrative   Not on file   Social Determinants of Health   Financial Resource Strain: Low Risk  (05/13/2023)   Overall Financial  Resource Strain (CARDIA)    Difficulty of Paying Living Expenses: Not hard at all  Food Insecurity: No Food Insecurity (05/13/2023)   Hunger Vital Sign    Worried About Running Out of Food in the Last Year: Never true    Ran Out of Food in the Last Year: Never true  Transportation Needs: No Transportation Needs (05/13/2023)   PRAPARE - Administrator, Civil Service (Medical): No    Lack of Transportation (Non-Medical): No  Physical Activity: Sufficiently Active (05/13/2023)   Exercise Vital Sign    Days of Exercise per Week: 4 days    Minutes of Exercise per Session: 150+ min  Stress: No Stress Concern Present (05/13/2023)   [employer] of Occupational Health - Occupational Stress Questionnaire    Feeling of Stress : Not at all  Social Connections: Unknown (05/13/2023)   Social Connection and Isolation Panel [NHANES]    Frequency of Communication with Friends and Family: More than three times a week    Frequency of Social Gatherings with Friends and Family: More than three times a week    Attends Religious Services: More than 4 times per year    Active Member of [employer] or Organizations:  Patient declined    Attends Banker Meetings: Not on file    Marital Status: Married    Family History  Problem Relation Age of Onset   Diabetes Father     Health Maintenance  Topic Date Due   Diabetic kidney evaluation - eGFR measurement  04/12/2023   HEMOGLOBIN A1C  04/30/2023   Fecal DNA (Cologuard)  08/14/2023 (Originally 01/10/2023)   OPHTHALMOLOGY EXAM  10/31/2023 (Originally 08/09/2022)   INFLUENZA VACCINE  01/19/2024 (Originally 05/22/2023)   Hepatitis C Screening  05/13/2024 (Originally 11/03/1987)   Diabetic kidney evaluation - Urine ACR  10/31/2023   FOOT EXAM  10/31/2023   DTaP/Tdap/Td (2 - Td or Tdap) 08/06/2026   HIV Screening  Completed   HPV VACCINES  Aged Out   COVID-19 Vaccine  Discontinued   Zoster Vaccines- Shingrix  Discontinued      ----------------------------------------------------------------------------------------------------------------------------------------------------------------------------------------------------------------- Physical Exam BP 113/69 (BP Location: Left Arm, Patient Position: Sitting, Cuff Size: Normal)   Pulse 88   Ht 6' (1.829 m)   Wt 209 lb (94.8 kg)   SpO2 97%   BMI 28.35 kg/m   Physical Exam Constitutional:      Appearance: Normal appearance.  Eyes:     General: No scleral icterus. Cardiovascular:     Rate and Rhythm: Normal rate and regular rhythm.  Pulmonary:     Effort: Pulmonary effort is normal.     Breath sounds: Normal breath sounds.  Musculoskeletal:     Cervical back: Neck supple.  Neurological:     Mental Status: He is alert.  Psychiatric:        Mood and Affect: Mood normal.        Behavior: Behavior normal.     ------------------------------------------------------------------------------------------------------------------------------------------------------------------------------------------------------------------- Assessment and Plan  Type 2 diabetes mellitus without complication, without long-term current use of insulin (HCC) Update A1c and renal function  Doing well with current medications.   Hyperlipidemia LDL goal <100 Doing well with atorvastatin at current strength. Update lipid panel.    Meds ordered this encounter  Medications   Dapagliflozin Pro-metFORMIN ER (XIGDUO XR) 07-999 MG TB24    Sig: Take 1 tablet by mouth daily.    Dispense:  90 tablet    Refill:  1   Dulaglutide (TRULICITY) 3 MG/0.5ML SOPN    Sig: Inject 3 mg as directed once a week.    Dispense:  6 mL    Refill:  1   tadalafil (CIALIS) 20 MG tablet    Sig: TAKE 1/2 TO 1 TABLET BY MOUTH DAILY AS NEEDED FOR E.D.    Dispense:  30 tablet    Refill:  11   atorvastatin (LIPITOR) 40 MG tablet    Sig: Take 1 tablet (40 mg total) by mouth daily.    Dispense:  90 tablet     Refill:  1    Return in about 6 months (around 11/14/2023) for T2DM.    This visit occurred during the SARS-CoV-2 public health emergency.  Safety protocols were in place, including screening questions prior to the visit, additional usage of staff PPE, and extensive cleaning of exam room while observing appropriate contact time as indicated for disinfecting solutions.

## 2023-05-14 NOTE — Assessment & Plan Note (Signed)
Doing well with atorvastatin at current strength. Update lipid panel.

## 2023-05-14 NOTE — Assessment & Plan Note (Signed)
Update A1c and renal function  Doing well with current medications.

## 2023-05-15 LAB — CBC WITH DIFFERENTIAL/PLATELET
EOS (ABSOLUTE): 0.5 10*3/uL — ABNORMAL HIGH (ref 0.0–0.4)
Eos: 6 %
Hematocrit: 51.1 % — ABNORMAL HIGH (ref 37.5–51.0)
Hemoglobin: 17.4 g/dL (ref 13.0–17.7)
Immature Grans (Abs): 0 10*3/uL (ref 0.0–0.1)
Immature Granulocytes: 0 %
Lymphocytes Absolute: 2.5 10*3/uL (ref 0.7–3.1)
MCH: 29.2 pg (ref 26.6–33.0)
MCHC: 34.1 g/dL (ref 31.5–35.7)
MCV: 86 fL (ref 79–97)
Monocytes Absolute: 0.4 10*3/uL (ref 0.1–0.9)
Monocytes: 5 %
Neutrophils: 57 %
Platelets: 229 10*3/uL (ref 150–450)
RBC: 5.96 x10E6/uL — ABNORMAL HIGH (ref 4.14–5.80)
WBC: 8.1 10*3/uL (ref 3.4–10.8)

## 2023-05-15 LAB — LIPID PANEL WITH LDL/HDL RATIO
Cholesterol, Total: 166 mg/dL (ref 100–199)
HDL: 33 mg/dL — ABNORMAL LOW (ref >39)
LDL Chol Calc (NIH): 98 mg/dL (ref 0–99)
LDL/HDL Ratio: 3 ratio (ref 0.0–3.6)
Triglycerides: 203 mg/dL — ABNORMAL HIGH (ref 0–149)

## 2023-05-15 LAB — CMP14+EGFR
ALT: 81 IU/L — ABNORMAL HIGH (ref 0–44)
Alkaline Phosphatase: 105 IU/L (ref 44–121)
BUN/Creatinine Ratio: 10 (ref 9–20)
BUN: 10 mg/dL (ref 6–24)
CO2: 23 mmol/L (ref 20–29)
Calcium: 10 mg/dL (ref 8.7–10.2)
Chloride: 99 mmol/L (ref 96–106)
Creatinine, Ser: 1.03 mg/dL (ref 0.76–1.27)
Globulin, Total: 2.8 g/dL (ref 1.5–4.5)
Sodium: 139 mmol/L (ref 134–144)
Total Protein: 7.6 g/dL (ref 6.0–8.5)
eGFR: 87 mL/min/{1.73_m2} (ref >59)

## 2023-05-15 LAB — HEMOGLOBIN A1C
Est. average glucose Bld gHb Est-mCnc: 203 mg/dL
Hgb A1c MFr Bld: 8.7 % — ABNORMAL HIGH (ref 4.8–5.6)

## 2023-05-15 LAB — MICROALBUMIN / CREATININE URINE RATIO: Microalbumin, Urine: <3 ug/mL

## 2023-05-16 ENCOUNTER — Other Ambulatory Visit: Payer: Self-pay | Admitting: Family Medicine

## 2023-05-16 MED ORDER — TIRZEPATIDE 7.5 MG/0.5ML ~~LOC~~ SOAJ: 7.5000 mg | SUBCUTANEOUS | 0 refills | Status: DC

## 2023-06-19 ENCOUNTER — Ambulatory Visit (INDEPENDENT_AMBULATORY_CARE_PROVIDER_SITE_OTHER): Payer: BC Managed Care – PPO | Admitting: Medical-Surgical

## 2023-06-19 ENCOUNTER — Encounter: Payer: Self-pay | Admitting: Medical-Surgical

## 2023-06-19 VITALS — BP 117/69 | HR 76 | Resp 20 | Ht 72.0 in | Wt 209.0 lb

## 2023-06-19 DIAGNOSIS — Z1211 Encounter for screening for malignant neoplasm of colon: Secondary | ICD-10-CM | POA: Diagnosis not present

## 2023-06-19 DIAGNOSIS — H9190 Unspecified hearing loss, unspecified ear: Secondary | ICD-10-CM | POA: Diagnosis not present
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
## 2023-08-13 ENCOUNTER — Emergency Department (HOSPITAL_COMMUNITY): Payer: BC Managed Care – PPO

## 2023-08-13 ENCOUNTER — Other Ambulatory Visit: Payer: Self-pay

## 2023-08-13 ENCOUNTER — Emergency Department (HOSPITAL_COMMUNITY)
Admission: EM | Admit: 2023-08-13 | Discharge: 2023-08-13 | Disposition: A | Discharge status: Home / Self Care | Payer: BC Managed Care – PPO | Attending: Emergency Medicine | Admitting: Emergency Medicine

## 2023-08-13 ENCOUNTER — Encounter (HOSPITAL_COMMUNITY): Payer: Self-pay

## 2023-08-13 DIAGNOSIS — S92902A Unspecified fracture of left foot, initial encounter for closed fracture: Secondary | ICD-10-CM | POA: Diagnosis not present

## 2023-08-13 DIAGNOSIS — M25562 Pain in left knee: Secondary | ICD-10-CM | POA: Insufficient documentation

## 2023-08-13 DIAGNOSIS — Z7982 Long term (current) use of aspirin: Secondary | ICD-10-CM | POA: Insufficient documentation

## 2023-08-13 DIAGNOSIS — S99922A Unspecified injury of left foot, initial encounter: Secondary | ICD-10-CM | POA: Diagnosis not present

## 2023-08-13 DIAGNOSIS — R739 Hyperglycemia, unspecified: Secondary | ICD-10-CM | POA: Diagnosis not present

## 2023-08-13 DIAGNOSIS — S92345A Nondisplaced fracture of fourth metatarsal bone, left foot, initial encounter for closed fracture: Secondary | ICD-10-CM | POA: Diagnosis not present

## 2023-08-13 DIAGNOSIS — S99912A Unspecified injury of left ankle, initial encounter: Secondary | ICD-10-CM | POA: Diagnosis not present

## 2023-08-13 DIAGNOSIS — M79662 Pain in left lower leg: Secondary | ICD-10-CM | POA: Diagnosis not present

## 2023-08-13 DIAGNOSIS — S92312A Displaced fracture of first metatarsal bone, left foot, initial encounter for closed fracture: Secondary | ICD-10-CM | POA: Diagnosis not present

## 2023-08-13 DIAGNOSIS — Y92410 Unspecified street and highway as the place of occurrence of the external cause: Secondary | ICD-10-CM | POA: Insufficient documentation

## 2023-08-13 DIAGNOSIS — S92212A Displaced fracture of cuboid bone of left foot, initial encounter for closed fracture: Secondary | ICD-10-CM | POA: Diagnosis not present

## 2023-08-13 DIAGNOSIS — M79605 Pain in left leg: Secondary | ICD-10-CM | POA: Diagnosis not present

## 2023-08-13 DIAGNOSIS — S92812A Other fracture of left foot, initial encounter for closed fracture: Secondary | ICD-10-CM | POA: Diagnosis not present

## 2023-08-13 LAB — CBG MONITORING, ED: Glucose-Capillary: 232 mg/dL — ABNORMAL HIGH (ref 70–99)

## 2023-08-13 MED ORDER — HYDROMORPHONE HCL 1 MG/ML IJ SOLN
1.0000 mg | Freq: Once | INTRAMUSCULAR | Status: AC
  Administered 2023-08-13: 1 mg via INTRAVENOUS
  Filled 2023-08-13: qty 1

## 2023-08-13 MED ORDER — OXYCODONE-ACETAMINOPHEN 5-325 MG PO TABS: 1.0000 | ORAL_TABLET | Freq: Four times a day (QID) | ORAL | 0 refills | Status: DC | PRN

## 2023-08-13 MED ORDER — MORPHINE SULFATE (PF) 4 MG/ML IV SOLN
6.0000 mg | Freq: Once | INTRAVENOUS | Status: AC
  Administered 2023-08-13: 6 mg via INTRAVENOUS
  Filled 2023-08-13: qty 2

## 2023-08-13 MED ORDER — ONDANSETRON HCL 4 MG/2ML IJ SOLN
4.0000 mg | Freq: Once | INTRAMUSCULAR | Status: AC
  Administered 2023-08-13: 4 mg via INTRAVENOUS
  Filled 2023-08-13: qty 2

## 2023-08-13 MED ORDER — METHOCARBAMOL 500 MG PO TABS: 500.0000 mg | ORAL_TABLET | Freq: Two times a day (BID) | ORAL | 0 refills | Status: AC

## 2023-08-13 NOTE — ED Notes (Signed)
Patient transported to CT 
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
## 2023-08-13 NOTE — Progress Notes (Signed)
Orthopedic Tech Progress Note Patient Details:  Shane Williams [birth year] [id number]  Ortho Devices Type of Ortho Device: Crutches Ortho Device/Splint Location: LLE Ortho Device/Splint Interventions: Ordered, Adjustment  Pt. Declined crutch training. Stated he has used crutches in the past and felt competent to use crutches given to him today. Post Interventions Patient Tolerated: Other (comment) Instructions Provided: Care of device  Tonye Pearson 08/13/2023, 4:19 PM

## 2023-08-13 NOTE — Discharge Instructions (Addendum)
Call the orthopedic surgeon today to schedule an appointment to be seen today or tomorrow
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
## 2023-08-13 NOTE — ED Notes (Signed)
Ortho techs at bedside to place splint.
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
## 2023-08-13 NOTE — Progress Notes (Signed)
Orthopedic Tech Progress Note Patient Details:  Shane Williams February 03, 1971 [id number]  Ortho Devices Type of Ortho Device: Post (short) splint, Stirrup splint Ortho Device/Splint Location: LLE Ortho Device/Splint Interventions: Ordered, Application, Adjustment   Post Interventions Patient Tolerated: Well Instructions Provided: Care of device  Tonye Pearson 08/13/2023, 3:08 PM

## 2023-08-14 DIAGNOSIS — M79672 Pain in left foot: Secondary | ICD-10-CM | POA: Diagnosis not present

## 2023-08-15 DIAGNOSIS — F17210 Nicotine dependence, cigarettes, uncomplicated: Secondary | ICD-10-CM | POA: Diagnosis not present

## 2023-08-15 DIAGNOSIS — Z716 Tobacco abuse counseling: Secondary | ICD-10-CM | POA: Diagnosis not present

## 2023-08-15 DIAGNOSIS — S92302A Fracture of unspecified metatarsal bone(s), left foot, initial encounter for closed fracture: Secondary | ICD-10-CM | POA: Diagnosis not present

## 2023-08-19 DIAGNOSIS — S92212A Displaced fracture of cuboid bone of left foot, initial encounter for closed fracture: Secondary | ICD-10-CM | POA: Diagnosis not present

## 2023-08-19 DIAGNOSIS — S93322A Subluxation of tarsometatarsal joint of left foot, initial encounter: Secondary | ICD-10-CM | POA: Diagnosis not present

## 2023-08-19 DIAGNOSIS — G8918 Other acute postprocedural pain: Secondary | ICD-10-CM | POA: Diagnosis not present

## 2023-08-19 DIAGNOSIS — S92222A Displaced fracture of lateral cuneiform of left foot, initial encounter for closed fracture: Secondary | ICD-10-CM | POA: Diagnosis not present

## 2023-08-19 DIAGNOSIS — S92312A Displaced fracture of first metatarsal bone, left foot, initial encounter for closed fracture: Secondary | ICD-10-CM | POA: Diagnosis not present

## 2023-08-19 DIAGNOSIS — S92332A Displaced fracture of third metatarsal bone, left foot, initial encounter for closed fracture: Secondary | ICD-10-CM | POA: Diagnosis not present

## 2023-08-19 DIAGNOSIS — S92342A Displaced fracture of fourth metatarsal bone, left foot, initial encounter for closed fracture: Secondary | ICD-10-CM | POA: Diagnosis not present

## 2023-08-19 DIAGNOSIS — S92242A Displaced fracture of medial cuneiform of left foot, initial encounter for closed fracture: Secondary | ICD-10-CM | POA: Diagnosis not present

## 2023-09-03 DIAGNOSIS — S92242A Displaced fracture of medial cuneiform of left foot, initial encounter for closed fracture: Secondary | ICD-10-CM | POA: Diagnosis not present

## 2023-10-03 DIAGNOSIS — S92242D Displaced fracture of medial cuneiform of left foot, subsequent encounter for fracture with routine healing: Secondary | ICD-10-CM | POA: Diagnosis not present

## 2023-11-05 DIAGNOSIS — S92242D Displaced fracture of medial cuneiform of left foot, subsequent encounter for fracture with routine healing: Secondary | ICD-10-CM | POA: Diagnosis not present

## 2023-11-10 DIAGNOSIS — R2689 Other abnormalities of gait and mobility: Secondary | ICD-10-CM | POA: Diagnosis not present

## 2023-11-10 DIAGNOSIS — M25572 Pain in left ankle and joints of left foot: Secondary | ICD-10-CM | POA: Diagnosis not present

## 2023-11-12 DIAGNOSIS — R2689 Other abnormalities of gait and mobility: Secondary | ICD-10-CM | POA: Diagnosis not present

## 2023-11-12 DIAGNOSIS — M25572 Pain in left ankle and joints of left foot: Secondary | ICD-10-CM | POA: Diagnosis not present

## 2023-11-14 ENCOUNTER — Encounter: Payer: Self-pay | Admitting: Family Medicine

## 2023-11-14 ENCOUNTER — Ambulatory Visit: Payer: BC Managed Care – PPO | Admitting: Family Medicine

## 2023-11-14 VITALS — BP 109/70 | HR 91 | Ht 72.0 in | Wt 208.0 lb

## 2023-11-14 DIAGNOSIS — E119 Type 2 diabetes mellitus without complications: Secondary | ICD-10-CM | POA: Diagnosis not present

## 2023-11-14 DIAGNOSIS — Z7984 Long term (current) use of oral hypoglycemic drugs: Secondary | ICD-10-CM

## 2023-11-14 DIAGNOSIS — E785 Hyperlipidemia, unspecified: Secondary | ICD-10-CM | POA: Diagnosis not present

## 2023-11-14 LAB — POCT GLYCOSYLATED HEMOGLOBIN (HGB A1C): HbA1c, POC (controlled diabetic range): 8.5 % — AB (ref 0.0–7.0)

## 2023-11-14 MED ORDER — TIRZEPATIDE 2.5 MG/0.5ML ~~LOC~~ SOAJ: 2.5000 mg | SUBCUTANEOUS | 0 refills | Status: DC

## 2023-11-14 MED ORDER — DAPAGLIFLOZIN PRO-METFORMIN ER 10-1000 MG PO TB24: 1.0000 | ORAL_TABLET | Freq: Every day | ORAL | 1 refills | Status: DC

## 2023-11-14 MED ORDER — TIRZEPATIDE 5 MG/0.5ML ~~LOC~~ SOAJ: 5.0000 mg | SUBCUTANEOUS | 0 refills | Status: DC

## 2023-11-14 MED ORDER — TIRZEPATIDE 7.5 MG/0.5ML ~~LOC~~ SOAJ: 7.5000 mg | SUBCUTANEOUS | 0 refills | Status: AC

## 2023-11-14 NOTE — Assessment & Plan Note (Signed)
Mounjaro needing PA, will update this and have him start once approved.  Continue xigduo.  F/u in 6 months.

## 2023-11-14 NOTE — Addendum Note (Signed)
Addended by: Mammie Lorenzo on: 11/14/2023 09:05 AM   Modules accepted: Orders
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
## 2023-11-14 NOTE — Assessment & Plan Note (Signed)
Doing well with atorvastatin at current strength, will continue

## 2023-11-17 DIAGNOSIS — M25572 Pain in left ankle and joints of left foot: Secondary | ICD-10-CM | POA: Diagnosis not present

## 2023-11-17 DIAGNOSIS — R2689 Other abnormalities of gait and mobility: Secondary | ICD-10-CM | POA: Diagnosis not present

## 2023-11-19 DIAGNOSIS — R2689 Other abnormalities of gait and mobility: Secondary | ICD-10-CM | POA: Diagnosis not present

## 2023-11-19 DIAGNOSIS — M25572 Pain in left ankle and joints of left foot: Secondary | ICD-10-CM | POA: Diagnosis not present

## 2023-11-25 DIAGNOSIS — M25572 Pain in left ankle and joints of left foot: Secondary | ICD-10-CM | POA: Diagnosis not present

## 2023-11-25 DIAGNOSIS — R2689 Other abnormalities of gait and mobility: Secondary | ICD-10-CM | POA: Diagnosis not present

## 2023-11-27 DIAGNOSIS — M25572 Pain in left ankle and joints of left foot: Secondary | ICD-10-CM | POA: Diagnosis not present

## 2023-11-27 DIAGNOSIS — R2689 Other abnormalities of gait and mobility: Secondary | ICD-10-CM | POA: Diagnosis not present

## 2023-12-04 DIAGNOSIS — M25572 Pain in left ankle and joints of left foot: Secondary | ICD-10-CM | POA: Diagnosis not present

## 2023-12-04 DIAGNOSIS — R2689 Other abnormalities of gait and mobility: Secondary | ICD-10-CM | POA: Diagnosis not present

## 2023-12-08 DIAGNOSIS — R2689 Other abnormalities of gait and mobility: Secondary | ICD-10-CM | POA: Diagnosis not present

## 2023-12-08 DIAGNOSIS — M25572 Pain in left ankle and joints of left foot: Secondary | ICD-10-CM | POA: Diagnosis not present

## 2023-12-10 DIAGNOSIS — S92242D Displaced fracture of medial cuneiform of left foot, subsequent encounter for fracture with routine healing: Secondary | ICD-10-CM | POA: Diagnosis not present

## 2023-12-12 DIAGNOSIS — M25572 Pain in left ankle and joints of left foot: Secondary | ICD-10-CM | POA: Diagnosis not present

## 2023-12-12 DIAGNOSIS — R2689 Other abnormalities of gait and mobility: Secondary | ICD-10-CM | POA: Diagnosis not present

## 2023-12-16 DIAGNOSIS — R2689 Other abnormalities of gait and mobility: Secondary | ICD-10-CM | POA: Diagnosis not present

## 2023-12-16 DIAGNOSIS — M25572 Pain in left ankle and joints of left foot: Secondary | ICD-10-CM | POA: Diagnosis not present

## 2023-12-19 DIAGNOSIS — R2689 Other abnormalities of gait and mobility: Secondary | ICD-10-CM | POA: Diagnosis not present

## 2023-12-19 DIAGNOSIS — M25572 Pain in left ankle and joints of left foot: Secondary | ICD-10-CM | POA: Diagnosis not present

## 2023-12-23 ENCOUNTER — Other Ambulatory Visit: Payer: Self-pay | Admitting: Family Medicine

## 2023-12-23 DIAGNOSIS — M25572 Pain in left ankle and joints of left foot: Secondary | ICD-10-CM | POA: Diagnosis not present

## 2023-12-23 DIAGNOSIS — R2689 Other abnormalities of gait and mobility: Secondary | ICD-10-CM | POA: Diagnosis not present

## 2023-12-25 DIAGNOSIS — M25572 Pain in left ankle and joints of left foot: Secondary | ICD-10-CM | POA: Diagnosis not present

## 2023-12-25 DIAGNOSIS — R2689 Other abnormalities of gait and mobility: Secondary | ICD-10-CM | POA: Diagnosis not present

## 2023-12-29 NOTE — Telephone Encounter (Signed)
 Pharmacy requesting new medication Rx due to [facility] being non-formulary.

## 2023-12-30 ENCOUNTER — Other Ambulatory Visit: Payer: Self-pay

## 2023-12-30 MED ORDER — DAPAGLIFLOZIN PRO-METFORMIN ER 10-1000 MG PO TB24: 1.0000 | ORAL_TABLET | Freq: Every day | ORAL | 5 refills | Status: DC

## 2024-01-01 ENCOUNTER — Other Ambulatory Visit: Payer: Self-pay | Admitting: Family Medicine

## 2024-01-02 DIAGNOSIS — R2689 Other abnormalities of gait and mobility: Secondary | ICD-10-CM | POA: Diagnosis not present

## 2024-01-02 DIAGNOSIS — M25572 Pain in left ankle and joints of left foot: Secondary | ICD-10-CM | POA: Diagnosis not present

## 2024-01-05 ENCOUNTER — Other Ambulatory Visit: Payer: Self-pay | Admitting: Family Medicine

## 2024-01-05 DIAGNOSIS — R2689 Other abnormalities of gait and mobility: Secondary | ICD-10-CM | POA: Diagnosis not present

## 2024-01-05 DIAGNOSIS — M25572 Pain in left ankle and joints of left foot: Secondary | ICD-10-CM | POA: Diagnosis not present

## 2024-01-07 DIAGNOSIS — M25572 Pain in left ankle and joints of left foot: Secondary | ICD-10-CM | POA: Diagnosis not present

## 2024-01-07 DIAGNOSIS — R2689 Other abnormalities of gait and mobility: Secondary | ICD-10-CM | POA: Diagnosis not present

## 2024-01-13 DIAGNOSIS — M25572 Pain in left ankle and joints of left foot: Secondary | ICD-10-CM | POA: Diagnosis not present

## 2024-01-13 DIAGNOSIS — R2689 Other abnormalities of gait and mobility: Secondary | ICD-10-CM | POA: Diagnosis not present

## 2024-01-15 DIAGNOSIS — R2689 Other abnormalities of gait and mobility: Secondary | ICD-10-CM | POA: Diagnosis not present

## 2024-01-15 DIAGNOSIS — M25572 Pain in left ankle and joints of left foot: Secondary | ICD-10-CM | POA: Diagnosis not present

## 2024-01-20 DIAGNOSIS — R2689 Other abnormalities of gait and mobility: Secondary | ICD-10-CM | POA: Diagnosis not present

## 2024-01-20 DIAGNOSIS — M25572 Pain in left ankle and joints of left foot: Secondary | ICD-10-CM | POA: Diagnosis not present

## 2024-01-22 DIAGNOSIS — M25572 Pain in left ankle and joints of left foot: Secondary | ICD-10-CM | POA: Diagnosis not present

## 2024-01-22 DIAGNOSIS — R2689 Other abnormalities of gait and mobility: Secondary | ICD-10-CM | POA: Diagnosis not present

## 2024-01-27 DIAGNOSIS — R2689 Other abnormalities of gait and mobility: Secondary | ICD-10-CM | POA: Diagnosis not present

## 2024-01-27 DIAGNOSIS — M25572 Pain in left ankle and joints of left foot: Secondary | ICD-10-CM | POA: Diagnosis not present

## 2024-01-30 DIAGNOSIS — M25572 Pain in left ankle and joints of left foot: Secondary | ICD-10-CM | POA: Diagnosis not present

## 2024-01-30 DIAGNOSIS — R2689 Other abnormalities of gait and mobility: Secondary | ICD-10-CM | POA: Diagnosis not present

## 2024-02-02 DIAGNOSIS — M25572 Pain in left ankle and joints of left foot: Secondary | ICD-10-CM | POA: Diagnosis not present

## 2024-02-02 DIAGNOSIS — R2689 Other abnormalities of gait and mobility: Secondary | ICD-10-CM | POA: Diagnosis not present

## 2024-02-04 DIAGNOSIS — M25572 Pain in left ankle and joints of left foot: Secondary | ICD-10-CM | POA: Diagnosis not present

## 2024-02-04 DIAGNOSIS — R2689 Other abnormalities of gait and mobility: Secondary | ICD-10-CM | POA: Diagnosis not present

## 2024-02-10 DIAGNOSIS — R2689 Other abnormalities of gait and mobility: Secondary | ICD-10-CM | POA: Diagnosis not present

## 2024-02-10 DIAGNOSIS — M25572 Pain in left ankle and joints of left foot: Secondary | ICD-10-CM | POA: Diagnosis not present

## 2024-02-13 ENCOUNTER — Other Ambulatory Visit: Payer: Self-pay | Admitting: Family Medicine

## 2024-02-13 DIAGNOSIS — M25572 Pain in left ankle and joints of left foot: Secondary | ICD-10-CM | POA: Diagnosis not present

## 2024-02-13 DIAGNOSIS — R2689 Other abnormalities of gait and mobility: Secondary | ICD-10-CM | POA: Diagnosis not present

## 2024-02-17 DIAGNOSIS — R2689 Other abnormalities of gait and mobility: Secondary | ICD-10-CM | POA: Diagnosis not present

## 2024-02-17 DIAGNOSIS — M25572 Pain in left ankle and joints of left foot: Secondary | ICD-10-CM | POA: Diagnosis not present

## 2024-02-25 DIAGNOSIS — S93325A Dislocation of tarsometatarsal joint of left foot, initial encounter: Secondary | ICD-10-CM | POA: Diagnosis not present

## 2024-05-07 ENCOUNTER — Other Ambulatory Visit (HOSPITAL_COMMUNITY): Payer: Self-pay

## 2024-05-07 ENCOUNTER — Telehealth: Payer: Self-pay

## 2024-05-07 NOTE — Telephone Encounter (Signed)
 Pharmacy Patient Advocate Encounter   Received notification from CoverMyMeds that prior authorization for Xigduo  XR 10-1000mg  ER tabs is required/requested.   Insurance verification completed.   The patient is insured through [facility] .   Per test claim: PA required; PA submitted to above mentioned insurance via CoverMyMeds Key/confirmation #/EOC [id number] Status is pending

## 2024-05-10 ENCOUNTER — Other Ambulatory Visit (HOSPITAL_COMMUNITY): Payer: Self-pay

## 2024-05-10 NOTE — Telephone Encounter (Signed)
 Pharmacy Patient Advocate Encounter  Received notification from [facility] that Prior Authorization for Xigduo  XR 10-1000mg  tabs has been APPROVED from 05/10/24 to 05/11/27   PA #/Case ID/Reference #: [id number]  Left a message at [facility] to notify of the approval

## 2024-05-11 ENCOUNTER — Other Ambulatory Visit (HOSPITAL_COMMUNITY): Payer: Self-pay

## 2024-05-11 ENCOUNTER — Telehealth: Payer: Self-pay

## 2024-05-11 NOTE — Telephone Encounter (Signed)
 Pharmacy Patient Advocate Encounter   Received notification from CoverMyMeds that prior authorization for Mounjaro  7.5MG /0.5ML auto-injectors is required/requested.   Insurance verification completed.   The patient is insured through [facility] .   Per test claim: PA required; PA started via CoverMyMeds. KEY [id number] . Waiting for clinical questions to populate.

## 2024-05-11 NOTE — Telephone Encounter (Signed)
 Clinical questions have been answered and PA submitted. PA currently Pending.

## 2024-05-13 ENCOUNTER — Ambulatory Visit: Payer: BC Managed Care – PPO | Admitting: Family Medicine

## 2024-05-14 ENCOUNTER — Other Ambulatory Visit (HOSPITAL_COMMUNITY): Payer: Self-pay

## 2024-05-14 NOTE — Telephone Encounter (Signed)
 Pharmacy Patient Advocate Encounter  Received notification from [facility] that Prior Authorization for Mounjaro  7.5MG /0.5ML auto-injectors  has been APPROVED from 05/13/24 to 05/14/27   PA #/Case ID/Reference #: [id number]

## 2024-06-16 ENCOUNTER — Ambulatory Visit: Admitting: Family Medicine

## 2024-06-22 ENCOUNTER — Ambulatory Visit (INDEPENDENT_AMBULATORY_CARE_PROVIDER_SITE_OTHER): Admitting: Family Medicine

## 2024-06-22 ENCOUNTER — Encounter: Payer: Self-pay | Admitting: Family Medicine

## 2024-06-22 VITALS — BP 119/63 | HR 82 | Ht 72.0 in | Wt 199.0 lb

## 2024-06-22 DIAGNOSIS — E785 Hyperlipidemia, unspecified: Secondary | ICD-10-CM

## 2024-06-22 DIAGNOSIS — E119 Type 2 diabetes mellitus without complications: Secondary | ICD-10-CM

## 2024-06-22 DIAGNOSIS — Z7985 Long-term (current) use of injectable non-insulin antidiabetic drugs: Secondary | ICD-10-CM

## 2024-06-22 DIAGNOSIS — Z125 Encounter for screening for malignant neoplasm of prostate: Secondary | ICD-10-CM | POA: Diagnosis not present

## 2024-06-22 LAB — POCT GLYCOSYLATED HEMOGLOBIN (HGB A1C): HbA1c, POC (controlled diabetic range): 5.8 % (ref 0.0–7.0)

## 2024-06-22 NOTE — Assessment & Plan Note (Signed)
 Doing well with atorvastatin  at current strength, will continue.  Update lipid panel.

## 2024-06-22 NOTE — Assessment & Plan Note (Signed)
 Blood sugars are well controlled.  He will continue xigduo  and mounjaro  at 5mg /weekly.  Return in 6 months.  Updating labs today.

## 2024-06-22 NOTE — Progress Notes (Signed)
 Shane Williams - 53 y.o. male MRN [id number]  Date of birth: 01-Jul-1971  Subjective Chief Complaint  Patient presents with   Diabetes   Hypertension   Hyperlipidemia    HPI Shane Williams is a 53 y.o. male here today for follow up visit.   Diabetes is managed with mounjaro  and xigduo .  Reports that he is doing really well.  Feels good with Mounjaro  at 5mg /week  A1c today is 5.8%.  He has been working on dietary changes.  Less fried food. Tolerating atorvastatin  well for associated HLD.    ROS:  A comprehensive ROS was completed and negative except as noted per HPI  Allergies  Allergen Reactions   Penicillins Swelling   Viagra  [Sildenafil  Citrate]     headache    Past Medical History:  Diagnosis Date   Low testosterone  11/27/2012   Type 2 diabetes mellitus (HCC) 11/27/2012    Past Surgical History:  Procedure Laterality Date   APPENDECTOMY     INCISE AND DRAIN ABCESS     4 times in last 2 years   SHOULDER ARTHROSCOPY      Social History   Socioeconomic History   Marital status: Married    Spouse name: Not on file   Number of children: Not on file   Years of education: Not on file   Highest education level: Associate degree: occupational, Scientist, product/process development, or vocational program  Occupational History   Not on file  Tobacco Use   Smoking status: Every Day    Current packs/day: 0.00    Average packs/day: 0.8 packs/day for 20.0 years (15.0 ttl pk-yrs)    Types: Cigarettes    Start date: 05/12/1997    Last attempt to quit: 05/12/2017    Years since quitting: 7.1   Smokeless tobacco: Never  Vaping Use   Vaping status: Every Day  Substance and Sexual Activity   Alcohol use: Not Currently   Drug use: No   Sexual activity: Not on file  Other Topics Concern   Not on file  Social History Narrative   Not on file   Social Drivers of Health   Financial Resource Strain: Low Risk  (06/21/2024)   Overall Financial Resource Strain (CARDIA)    Difficulty of Paying  Living Expenses: Not hard at all  Food Insecurity: No Food Insecurity (06/21/2024)   Hunger Vital Sign    Worried About Running Out of Food in the Last Year: Never true    Ran Out of Food in the Last Year: Never true  Transportation Needs: No Transportation Needs (06/21/2024)   PRAPARE - Administrator, Civil Service (Medical): No    Lack of Transportation (Non-Medical): No  Physical Activity: Sufficiently Active (06/21/2024)   Exercise Vital Sign    Days of Exercise per Week: 5 days    Minutes of Exercise per Session: 30 min  Stress: No Stress Concern Present (06/21/2024)   [employer] of Occupational Health - Occupational Stress Questionnaire    Feeling of Stress: Only a little  Social Connections: Moderately Integrated (06/21/2024)   Social Connection and Isolation Panel    Frequency of Communication with Friends and Family: More than three times a week    Frequency of Social Gatherings with Friends and Family: More than three times a week    Attends Religious Services: Patient declined    Database administrator or Organizations: Yes    Attends Banker Meetings: Patient declined    Marital Status: Married  Family History  Problem Relation Age of Onset   Diabetes Father     Health Maintenance  Topic Date Due   Hepatitis C Screening  Never done   Hepatitis B Vaccines 19-59 Average Risk (1 of 3 - 19+ 3-dose series) Never done   OPHTHALMOLOGY EXAM  08/09/2022   Diabetic kidney evaluation - eGFR measurement  05/13/2024   Diabetic kidney evaluation - Urine ACR  05/13/2024   INFLUENZA VACCINE  01/18/2025 (Originally 05/21/2024)   Pneumococcal Vaccine: 50+ Years (2 of 2 - PCV) 06/22/2025 (Originally 08/06/2017)   HEMOGLOBIN A1C  12/20/2024   FOOT EXAM  06/22/2025   Fecal DNA (Cologuard)  06/18/2026   DTaP/Tdap/Td (2 - Td or Tdap) 08/06/2026   HIV Screening  Completed   HPV VACCINES  Aged Out   Meningococcal B Vaccine  Aged Out   COVID-19 Vaccine   Discontinued   Zoster Vaccines- Shingrix  Discontinued     ----------------------------------------------------------------------------------------------------------------------------------------------------------------------------------------------------------------- Physical Exam BP 119/63 (BP Location: Left Arm, Patient Position: Sitting, Cuff Size: Normal)   Pulse 82   Ht 6' (1.829 m)   Wt 199 lb (90.3 kg)   SpO2 99%   BMI 26.99 kg/m   Physical Exam Constitutional:      Appearance: Normal appearance.  HENT:     Head: Normocephalic and atraumatic.  Cardiovascular:     Rate and Rhythm: Normal rate and regular rhythm.  Pulmonary:     Effort: Pulmonary effort is normal.     Breath sounds: Normal breath sounds.  Neurological:     General: No focal deficit present.     Mental Status: He is alert.  Psychiatric:        Mood and Affect: Mood normal.        Behavior: Behavior normal.     ------------------------------------------------------------------------------------------------------------------------------------------------------------------------------------------------------------------- Assessment and Plan  Type 2 diabetes mellitus without complication, without long-term current use of insulin (HCC) Blood sugars are well controlled.  He will continue xigduo  and mounjaro  at 5mg /weekly.  Return in 6 months.  Updating labs today.   Hyperlipidemia LDL goal <100 Doing well with atorvastatin  at current strength, will continue.  Update lipid panel.    No orders of the defined types were placed in this encounter.   Return in about 6 months (around 12/20/2024) for Type 2 Diabetes.

## 2024-06-23 LAB — CMP14+EGFR
ALT: 18 IU/L (ref 0–44)
AST: 14 IU/L (ref 0–40)
Albumin: 4.3 g/dL (ref 3.8–4.9)
Alkaline Phosphatase: 89 IU/L (ref 44–121)
BUN/Creatinine Ratio: 12 (ref 9–20)
BUN: 12 mg/dL (ref 6–24)
Bilirubin Total: 0.5 mg/dL (ref 0.0–1.2)
CO2: 22 mmol/L (ref 20–29)
Calcium: 9.3 mg/dL (ref 8.7–10.2)
Chloride: 103 mmol/L (ref 96–106)
Creatinine, Ser: 1.01 mg/dL (ref 0.76–1.27)
Globulin, Total: 2.3 g/dL (ref 1.5–4.5)
Glucose: 115 mg/dL — ABNORMAL HIGH (ref 70–99)
Potassium: 4.3 mmol/L (ref 3.5–5.2)
Sodium: 142 mmol/L (ref 134–144)
Total Protein: 6.6 g/dL (ref 6.0–8.5)
eGFR: 88 mL/min/1.73 (ref >59)

## 2024-06-23 LAB — CBC WITH DIFFERENTIAL/PLATELET
Basophils Absolute: 0.1 x10E3/uL (ref 0.0–0.2)
Basos: 1 %
EOS (ABSOLUTE): 0.4 x10E3/uL (ref 0.0–0.4)
Eos: 6 %
Hematocrit: 49.7 % (ref 37.5–51.0)
Hemoglobin: 16.1 g/dL (ref 13.0–17.7)
Immature Grans (Abs): 0 x10E3/uL (ref 0.0–0.1)
Immature Granulocytes: 0 %
Lymphocytes Absolute: 2.1 x10E3/uL (ref 0.7–3.1)
Lymphs: 33 %
MCH: 30.1 pg (ref 26.6–33.0)
MCHC: 32.4 g/dL (ref 31.5–35.7)
MCV: 93 fL (ref 79–97)
Monocytes Absolute: 0.3 x10E3/uL (ref 0.1–0.9)
Monocytes: 5 %
Neutrophils Absolute: 3.5 x10E3/uL (ref 1.4–7.0)
Neutrophils: 55 %
Platelets: 224 x10E3/uL (ref 150–450)
RBC: 5.35 x10E6/uL (ref 4.14–5.80)
RDW: 13.3 % (ref 11.6–15.4)
WBC: 6.4 x10E3/uL (ref 3.4–10.8)

## 2024-06-23 LAB — LIPID PANEL WITH LDL/HDL RATIO
Cholesterol, Total: 107 mg/dL (ref 100–199)
HDL: 30 mg/dL — ABNORMAL LOW (ref >39)
LDL Chol Calc (NIH): 53 mg/dL (ref 0–99)
LDL/HDL Ratio: 1.8 ratio (ref 0.0–3.6)
Triglycerides: 136 mg/dL (ref 0–149)
VLDL Cholesterol Cal: 24 mg/dL (ref 5–40)

## 2024-06-23 LAB — MICROALBUMIN / CREATININE URINE RATIO
Creatinine, Urine: 64.5 mg/dL
Microalb/Creat Ratio: <5 mg/g{creat} (ref 0–29)
Microalbumin, Urine: <3 ug/mL

## 2024-06-23 LAB — PSA: Prostate Specific Ag, Serum: 0.5 ng/mL (ref 0.0–4.0)

## 2024-06-27 ENCOUNTER — Ambulatory Visit: Payer: Self-pay | Admitting: Family Medicine

## 2024-07-19 ENCOUNTER — Other Ambulatory Visit: Payer: Self-pay | Admitting: Family Medicine

## 2024-11-18 ENCOUNTER — Other Ambulatory Visit: Payer: Self-pay | Admitting: Family Medicine

## 2024-11-18 DIAGNOSIS — E119 Type 2 diabetes mellitus without complications: Secondary | ICD-10-CM

## 2024-11-18 DIAGNOSIS — E785 Hyperlipidemia, unspecified: Secondary | ICD-10-CM

## 2024-12-21 ENCOUNTER — Ambulatory Visit: Admitting: Family Medicine
